# Patient Record
Sex: Female | Born: 1960 | Race: White | Hispanic: No | Marital: Married | Smoking: Never smoker
Health system: Southern US, Community
[De-identification: ages and names within clinical notes are randomized; demographics above are authoritative.]

## PROBLEM LIST (undated history)

## (undated) DIAGNOSIS — F419 Anxiety disorder, unspecified: Secondary | ICD-10-CM

## (undated) DIAGNOSIS — F329 Major depressive disorder, single episode, unspecified: Secondary | ICD-10-CM

## (undated) DIAGNOSIS — F32A Depression, unspecified: Secondary | ICD-10-CM

## (undated) DIAGNOSIS — N3010 Interstitial cystitis (chronic) without hematuria: Secondary | ICD-10-CM

## (undated) DIAGNOSIS — E119 Type 2 diabetes mellitus without complications: Secondary | ICD-10-CM

## (undated) DIAGNOSIS — R0981 Nasal congestion: Secondary | ICD-10-CM

## (undated) DIAGNOSIS — K5900 Constipation, unspecified: Secondary | ICD-10-CM

## (undated) DIAGNOSIS — G4730 Sleep apnea, unspecified: Secondary | ICD-10-CM

## (undated) DIAGNOSIS — K589 Irritable bowel syndrome without diarrhea: Secondary | ICD-10-CM

## (undated) HISTORY — DX: Sleep apnea, unspecified: G47.30

## (undated) HISTORY — DX: Anxiety disorder, unspecified: F41.9

## (undated) HISTORY — PX: DILATION AND CURETTAGE OF UTERUS: SHX78

## (undated) HISTORY — PX: SHOULDER ARTHROSCOPY W/ ROTATOR CUFF REPAIR: SHX2400

## (undated) HISTORY — DX: Type 2 diabetes mellitus without complications: E11.9

## (undated) HISTORY — DX: Constipation, unspecified: K59.00

## (undated) HISTORY — PX: MOHS SURGERY: SUR867

---
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
## 2006-06-15 ENCOUNTER — Ambulatory Visit: Payer: Self-pay | Admitting: Obstetrics and Gynecology

## 2006-08-18 ENCOUNTER — Other Ambulatory Visit: Payer: Self-pay

## 2006-08-23 ENCOUNTER — Ambulatory Visit: Payer: Self-pay | Admitting: Otolaryngology

## 2007-08-10 ENCOUNTER — Ambulatory Visit: Payer: Self-pay | Admitting: Obstetrics and Gynecology

## 2008-03-28 ENCOUNTER — Ambulatory Visit: Payer: Self-pay

## 2008-04-02 ENCOUNTER — Ambulatory Visit: Payer: Self-pay

## 2008-08-29 ENCOUNTER — Ambulatory Visit: Payer: Self-pay | Admitting: Obstetrics and Gynecology

## 2008-09-04 ENCOUNTER — Ambulatory Visit: Payer: Self-pay | Admitting: Obstetrics and Gynecology

## 2008-10-08 ENCOUNTER — Ambulatory Visit: Payer: Self-pay | Admitting: Gastroenterology

## 2008-10-08 HISTORY — PX: COLONOSCOPY: SHX174

## 2008-10-10 ENCOUNTER — Ambulatory Visit: Payer: Self-pay | Admitting: Family Medicine

## 2008-10-10 ENCOUNTER — Ambulatory Visit: Payer: Self-pay | Admitting: General Surgery

## 2008-10-18 ENCOUNTER — Ambulatory Visit: Payer: Self-pay | Admitting: General Surgery

## 2008-12-06 HISTORY — PX: OTHER SURGICAL HISTORY: SHX169

## 2009-07-10 ENCOUNTER — Ambulatory Visit: Payer: Self-pay | Admitting: Otolaryngology

## 2009-12-15 ENCOUNTER — Ambulatory Visit: Payer: Self-pay | Admitting: Family Medicine

## 2009-12-23 ENCOUNTER — Ambulatory Visit: Payer: Self-pay | Admitting: Family Medicine

## 2010-01-19 ENCOUNTER — Ambulatory Visit: Payer: Self-pay | Admitting: Family Medicine

## 2011-01-06 LAB — HM COLONOSCOPY

## 2011-03-02 ENCOUNTER — Ambulatory Visit: Payer: Self-pay | Admitting: Internal Medicine

## 2011-12-08 ENCOUNTER — Ambulatory Visit: Payer: Self-pay | Admitting: Unknown Physician Specialty

## 2011-12-20 ENCOUNTER — Ambulatory Visit: Payer: Self-pay | Admitting: Otolaryngology

## 2011-12-20 LAB — CBC WITH DIFFERENTIAL/PLATELET
Basophil #: 0.1 10*3/uL (ref 0.0–0.1)
Basophil %: 0.9 %
Eosinophil #: 0.1 10*3/uL (ref 0.0–0.7)
Eosinophil %: 1.1 %
HGB: 14.1 g/dL (ref 12.0–16.0)
Lymphocyte #: 1.9 10*3/uL (ref 1.0–3.6)
Lymphocyte %: 21 %
MCHC: 33.3 g/dL (ref 32.0–36.0)
Neutrophil %: 64.7 %
Platelet: 248 10*3/uL (ref 150–440)
RBC: 4.63 10*6/uL (ref 3.80–5.20)
RDW: 12.5 % (ref 11.5–14.5)

## 2012-05-18 ENCOUNTER — Ambulatory Visit: Payer: Self-pay | Admitting: Internal Medicine

## 2012-10-04 DIAGNOSIS — G4730 Sleep apnea, unspecified: Secondary | ICD-10-CM | POA: Insufficient documentation

## 2013-05-06 LAB — HM PAP SMEAR: HM Pap smear: NEGATIVE

## 2013-06-22 ENCOUNTER — Ambulatory Visit: Payer: Self-pay | Admitting: Internal Medicine

## 2014-04-18 ENCOUNTER — Ambulatory Visit: Payer: Self-pay

## 2014-04-18 LAB — URINALYSIS, COMPLETE
BILIRUBIN, UR: NEGATIVE
Blood: NEGATIVE
GLUCOSE, UR: NEGATIVE mg/dL (ref 0–75)
Ketone: NEGATIVE
LEUKOCYTE ESTERASE: NEGATIVE
NITRITE: NEGATIVE
PH: 7.5 (ref 4.5–8.0)
Protein: NEGATIVE
RBC,UR: NONE SEEN /HPF (ref 0–5)
Specific Gravity: 1.005 (ref 1.003–1.030)
Squamous Epithelial: NONE SEEN

## 2014-06-18 LAB — LIPID PANEL
CHOLESTEROL: 266 mg/dL — AB (ref 0–200)
HDL: 60 mg/dL (ref 35–70)
LDL Cholesterol: 174 mg/dL
Triglycerides: 158 mg/dL (ref 40–160)

## 2014-06-18 LAB — BASIC METABOLIC PANEL
BUN: 18 mg/dL (ref 4–21)
Creatinine: 0.7 mg/dL (ref <=1.1)

## 2014-06-18 LAB — TSH: TSH: 3.04 u[IU]/mL (ref <=5.90)

## 2014-07-09 ENCOUNTER — Ambulatory Visit: Payer: Self-pay | Admitting: Internal Medicine

## 2014-07-09 LAB — HM MAMMOGRAPHY: HM Mammogram: NORMAL

## 2015-05-14 ENCOUNTER — Ambulatory Visit
Admission: EM | Admit: 2015-05-14 | Discharge: 2015-05-14 | Disposition: A | Discharge status: Home / Self Care | Payer: BC Managed Care – PPO | Attending: Internal Medicine | Admitting: Internal Medicine

## 2015-05-14 DIAGNOSIS — N390 Urinary tract infection, site not specified: Secondary | ICD-10-CM | POA: Diagnosis present

## 2015-05-14 HISTORY — DX: Nasal congestion: R09.81

## 2015-05-14 LAB — URINALYSIS COMPLETE WITH MICROSCOPIC (ARMC ONLY)
Bilirubin Urine: NEGATIVE
Glucose, UA: NEGATIVE mg/dL
Ketones, ur: NEGATIVE mg/dL
Nitrite: NEGATIVE
Specific Gravity, Urine: 1.02 (ref 1.005–1.030)
pH: 6 (ref 5.0–8.0)

## 2015-05-14 MED ORDER — PHENAZOPYRIDINE HCL 200 MG PO TABS: 200.0000 mg | ORAL_TABLET | Freq: Three times a day (TID) | ORAL | Status: DC

## 2015-05-14 MED ORDER — CIPROFLOXACIN HCL 500 MG PO TABS: 500.0000 mg | ORAL_TABLET | Freq: Two times a day (BID) | ORAL | Status: DC

## 2015-05-14 NOTE — ED Notes (Signed)
Pt c/o painful urination with odor for the past 3 days with hx of UTI

## 2015-05-14 NOTE — Discharge Instructions (Signed)

## 2015-05-14 NOTE — ED Provider Notes (Signed)
CSN: [id number]     Arrival date & time 05/14/15  0708 History   None    Chief Complaint  Patient presents with  . 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  Would like pyridium today.  Patient is a 54 y.o. female presenting with urinary tract infection. The history is provided by the patient.  Urinary Tract Infection Pain quality:  Burning Pain severity:  No pain Onset quality:  Sudden Duration:  3 days Timing:  Intermittent Progression:  Unchanged Chronicity:  Recurrent Recent urinary tract infections: no   Relieved by:  Nothing Worsened by:  Nothing tried Ineffective treatments:  Cranberry juice Urinary symptoms: foul-smelling urine and frequent urination   Urinary symptoms: no discolored urine, no hematuria, no hesitancy and no bladder incontinence   Associated symptoms: no abdominal pain, no fever, no flank pain, no genital lesions, no nausea, no vaginal discharge and no vomiting   Risk factors: no hx of pyelonephritis, no hx of urolithiasis, no kidney transplant, not pregnant, no recurrent urinary tract infections, no renal cysts, no renal disease, not sexually active, not single kidney, no sexually transmitted infections and no urinary catheter     Past Medical History  Diagnosis Date  . Sinus congestion    Past Surgical History  Procedure Laterality Date  . Dilation and curettage of uterus      x2   History reviewed. No pertinent family history. History  Substance Use Topics  . Smoking status: Never Smoker   . Smokeless tobacco: Never Used  . Alcohol Use: Yes   OB History    Gravida Para Term Preterm AB TAB SAB Ectopic Multiple Living   4         2     Review of Systems   Constitutional: Negative for fever, chills, diaphoresis, activity change, appetite change and fatigue.  HENT: Negative for facial swelling, mouth sores and rhinorrhea.   Eyes: Negative for pain, discharge, redness and itching.  Respiratory: Negative for cough, shortness of breath and wheezing.   Cardiovascular: Negative for chest pain, palpitations and leg swelling.  Gastrointestinal: Negative for nausea, vomiting, abdominal pain, diarrhea, constipation, blood in stool and abdominal distention.  Endocrine: Negative for cold intolerance and heat intolerance.  Genitourinary: Positive for dysuria, urgency and frequency. Negative for hematuria, flank pain, decreased urine volume, vaginal discharge, difficulty urinating and genital sores.  Musculoskeletal: Negative for myalgias, back pain, joint swelling, arthralgias, gait problem, neck pain and neck stiffness.  Skin: Negative for color change, pallor, rash and wound.  Allergic/Immunologic: Positive for environmental allergies. Negative for food allergies.  Neurological: Negative for dizziness, tremors, syncope, facial asymmetry, weakness, light-headedness and headaches.  Hematological: Negative for adenopathy. Does not bruise/bleed easily.  Psychiatric/Behavioral: Negative for behavioral problems, confusion, sleep disturbance and agitation.    Allergies  Review of patient's allergies indicates no known allergies.  Home Medications   Prior to Admission medications   Medication Sig Start Date End Date Taking? Authorizing Provider  FLUoxetine (PROZAC) 40 MG capsule Take 40 mg by mouth daily.   Yes Historical Provider, MD  guaiFENesin (MUCINEX) 600 MG 12 hr tablet Take by mouth 2 (two) times daily as needed.   Yes Historical Provider, MD  Multiple Vitamins-Minerals (MULTIVITAMIN WITH MINERALS) tablet Take 1 tablet by mouth daily.  Yes Historical Provider, MD  ciprofloxacin (CIPRO) 500 MG tablet Take 1 tablet (500 mg total) by mouth every 12  (twelve) hours. 05/14/15   Olen Cordial, NP  phenazopyridine (PYRIDIUM) 200 MG tablet Take 1 tablet (200 mg total) by mouth 3 (three) times daily. 05/14/15   Aura Fey Betancourt, NP   BP 132/80 mmHg  Pulse 72  Temp(Src) 97.8 F (36.6 C) (Oral)  Resp 16  Ht 5\' 5"  (1.651 m)  Wt 158 lb (71.668 kg)  BMI 26.29 kg/m2  SpO2 99% Physical Exam  Constitutional: She is oriented to person, place, and time. Vital signs are normal. She appears well-developed and well-nourished. No distress.  HENT:  Head: Normocephalic and atraumatic.  Right Ear: External ear normal.  Left Ear: External ear normal.  Nose: Nose normal.  Mouth/Throat: Oropharynx is clear and moist. No oropharyngeal exudate.  Eyes: Conjunctivae, EOM and lids are normal. Pupils are equal, round, and reactive to light. Right eye exhibits no discharge. Left eye exhibits no discharge. No scleral icterus.  Neck: Trachea normal and normal range of motion. Neck supple. No tracheal deviation present.  Cardiovascular: Normal rate, regular rhythm, normal heart sounds and intact distal pulses.  Exam reveals no gallop and no friction rub.   No murmur heard. Pulmonary/Chest: Effort normal and breath sounds normal. No respiratory distress. She has no wheezes. She has no rales.  Abdominal: Soft. Bowel sounds are normal. She exhibits no distension, no fluid wave, no abdominal bruit, no ascites, no pulsatile midline mass and no mass. There is no tenderness. There is no rigidity, no rebound, no guarding, no CVA tenderness, no tenderness at McBurney's point and negative Murphy's sign.  Dull to percussion x 4 quads  Musculoskeletal: Normal range of motion. She exhibits no edema or tenderness.  Lymphadenopathy:    She has no cervical adenopathy.  Neurological: She is alert and oriented to person, place, and time. Coordination normal.  Skin: Skin is warm, dry and intact. No rash noted. She is not diaphoretic. No erythema. No pallor.  Psychiatric: She has a  normal mood and affect. Her speech is normal and behavior is normal. Judgment and thought content normal. Cognition and memory are normal.  Nursing note and vitals reviewed.   ED Course  Procedures (including critical care time) Labs Review Labs Reviewed  URINE CULTURE  URINALYSIS COMPLETEWITH MICROSCOPIC ([place])    Imaging Review No results found.   MDM   1. UTI (lower urinary tract infection)    Medications as directed.  Patient is also to push fluids and may use Pyridium po as needed. Call or return to clinic as needed if these symptoms worsen or fail to improve as anticipated.  Exitcare handout on cystitis given to patient Patient verbalized agreement and understanding of treatment plan and had no further questions at this time. P2:  Hydrate and cranberry juice    Olen Cordial, NP 05/14/15 [phone]

## 2015-05-15 DIAGNOSIS — F988 Other specified behavioral and emotional disorders with onset usually occurring in childhood and adolescence: Secondary | ICD-10-CM | POA: Insufficient documentation

## 2015-05-15 DIAGNOSIS — D509 Iron deficiency anemia, unspecified: Secondary | ICD-10-CM | POA: Insufficient documentation

## 2015-05-15 DIAGNOSIS — T7840XA Allergy, unspecified, initial encounter: Secondary | ICD-10-CM | POA: Insufficient documentation

## 2015-05-15 DIAGNOSIS — G4733 Obstructive sleep apnea (adult) (pediatric): Secondary | ICD-10-CM | POA: Insufficient documentation

## 2015-05-15 DIAGNOSIS — N3010 Interstitial cystitis (chronic) without hematuria: Secondary | ICD-10-CM | POA: Insufficient documentation

## 2015-05-15 DIAGNOSIS — F329 Major depressive disorder, single episode, unspecified: Secondary | ICD-10-CM | POA: Insufficient documentation

## 2015-05-15 DIAGNOSIS — Z8489 Family history of other specified conditions: Secondary | ICD-10-CM | POA: Insufficient documentation

## 2015-05-15 DIAGNOSIS — N6019 Diffuse cystic mastopathy of unspecified breast: Secondary | ICD-10-CM | POA: Insufficient documentation

## 2015-05-15 DIAGNOSIS — F419 Anxiety disorder, unspecified: Secondary | ICD-10-CM

## 2015-05-17 LAB — URINE CULTURE: Special Requests: NORMAL

## 2015-06-23 ENCOUNTER — Ambulatory Visit (INDEPENDENT_AMBULATORY_CARE_PROVIDER_SITE_OTHER): Payer: BC Managed Care – PPO | Admitting: Internal Medicine

## 2015-06-23 ENCOUNTER — Encounter: Payer: Self-pay | Admitting: Internal Medicine

## 2015-06-23 VITALS — BP 112/78 | HR 68 | Ht 65.0 in | Wt 164.4 lb

## 2015-06-23 DIAGNOSIS — G4730 Sleep apnea, unspecified: Secondary | ICD-10-CM

## 2015-06-23 DIAGNOSIS — Z1239 Encounter for other screening for malignant neoplasm of breast: Secondary | ICD-10-CM

## 2015-06-23 DIAGNOSIS — F419 Anxiety disorder, unspecified: Secondary | ICD-10-CM

## 2015-06-23 DIAGNOSIS — E785 Hyperlipidemia, unspecified: Secondary | ICD-10-CM

## 2015-06-23 DIAGNOSIS — Z0000 Encounter for general adult medical examination without abnormal findings: Secondary | ICD-10-CM | POA: Diagnosis not present

## 2015-06-23 DIAGNOSIS — F418 Other specified anxiety disorders: Secondary | ICD-10-CM | POA: Diagnosis not present

## 2015-06-23 DIAGNOSIS — F329 Major depressive disorder, single episode, unspecified: Secondary | ICD-10-CM

## 2015-06-23 DIAGNOSIS — D490 Neoplasm of unspecified behavior of digestive system: Secondary | ICD-10-CM

## 2015-06-23 DIAGNOSIS — F32A Depression, unspecified: Secondary | ICD-10-CM

## 2015-06-23 NOTE — Patient Instructions (Signed)

## 2015-06-23 NOTE — Progress Notes (Signed)
Date:  06/23/2015   Name:  Lauren Barrera   DOB:  August 03, 1961   MRN:  [id number]   Chief Complaint: Annual Exam Lauren Barrera is a 54 y.o. female who presents today for her Complete Annual Exam. She feels well. She reports exercising doing yoga 2-3/wk. She reports she is sleeping well. No breast complaints - Mammogram is due soon. Depression Well controlled symptoms on Prozac.  She is over the recent stress of her daughter's wedding.  She was unable to take Wellbutrin - constipation.  She is now sleeping well and does not need medication. Sleep Apnea Doing well with CPAP therapy.  She is very compliant and is followed by sleep specialist.  Review of Systems:  Review of Systems  Constitutional: Negative for fever, chills and unexpected weight change.  HENT: Positive for congestion. Negative for hearing loss, sore throat, trouble swallowing and voice change.   Eyes: Negative for visual disturbance.  Respiratory: Negative for cough, chest tightness, shortness of breath and wheezing.   Cardiovascular: Negative for chest pain, palpitations and leg swelling.  Gastrointestinal: Negative for abdominal pain, constipation and anal bleeding.  Genitourinary: Negative for dysuria, urgency, hematuria, vaginal bleeding, vaginal discharge and pelvic pain.  Musculoskeletal: Negative for back pain, joint swelling and gait problem.  Skin: Negative for color change and rash.  Neurological: Negative for headaches.  Hematological: Negative for adenopathy.  Psychiatric/Behavioral: Negative for sleep disturbance, dysphoric mood and decreased concentration.    Patient Active Problem List   Diagnosis Date Noted  . Hyperlipidemia 06/23/2015  . 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 Anxiety and depression 2015-05-22  . ADD (attention deficit disorder) 05/22/15  . Allergic state 05/22/2015  . Family history of sudden death 05-22-15  . Bloodgood disease 05-22-2015  . Chronic interstitial cystitis 22-May-2015  .  Hypochromic microcytic anemia May 22, 2015  . Obstructive sleep apnea of adult May 22, 2015  . Apnea, sleep 10/04/2012    Prior to Admission medications   Medication Sig Start Date End Date Taking? Authorizing Provider  FLUoxetine (PROZAC) 40 MG capsule Take 40 mg by mouth daily.   Yes Historical Provider, MD  guaiFENesin (MUCINEX) 600 MG 12 hr tablet Take by mouth 2 (two) times daily as needed.   Yes Historical Provider, MD  mometasone (NASONEX) 50 MCG/ACT nasal spray Place 2 sprays into the nose.   Yes Historical Provider, MD  Multiple Vitamins-Minerals (MULTIVITAMIN WITH MINERALS) tablet Take 1 tablet by mouth daily.   Yes Historical Provider, MD  buPROPion (WELLBUTRIN XL) 150 MG 24 hr tablet Take 1 tablet by mouth daily. 04/14/15   Historical Provider, MD  temazepam (RESTORIL) 15 MG capsule Take 1 capsule by mouth at bedtime. 04/14/15   Historical Provider, MD    Allergies  Allergen Reactions  . Codeine Other (See Comments)    Other Reaction: causes prickly sensation    Past Surgical History  Procedure Laterality Date  . Dilation and curettage of uterus      x2  . Appendectomy  2010  . Resection of endocrine tumor  2010    benign pancreatic lesion    History  Substance Use Topics  . Smoking status: Never Smoker   . Smokeless tobacco: Never Used  . Alcohol Use: Yes     Medication list has been reviewed and updated.  Physical Examination:  Physical Exam  Constitutional: She is oriented to person, place, and time. She appears well-developed and well-nourished. No distress.  HENT:  Head: Normocephalic and atraumatic.  Right Ear: Tympanic membrane and  ear canal normal.  Left Ear: Tympanic membrane and ear canal normal.  Nose: Nose normal. Right sinus exhibits no maxillary sinus tenderness. Left sinus exhibits no maxillary sinus tenderness.  Eyes: Conjunctivae are normal. Right eye exhibits no discharge. Left eye exhibits no discharge. No scleral icterus.  Neck: Normal range  of motion. Neck supple. No tracheal tenderness present. Carotid bruit is not present. No thyromegaly present.  Cardiovascular: Normal rate, regular rhythm, S1 normal and normal pulses.   Pulmonary/Chest: Effort normal and breath sounds normal. No respiratory distress. Right breast exhibits no mass, no nipple discharge, no skin change and no tenderness. Left breast exhibits no mass, no nipple discharge, no skin change and no tenderness.  Abdominal: Soft. Normal appearance and bowel sounds are normal. There is no hepatosplenomegaly. There is no tenderness. There is no CVA tenderness.  Musculoskeletal: Normal range of motion.       Right knee: Normal.       Left knee: Normal.  Lymphadenopathy:    She has no cervical adenopathy.    She has no axillary adenopathy.  Neurological: She is alert and oriented to person, place, and time. She has normal strength and normal reflexes.  Skin: Skin is warm, dry and intact. No rash noted.  Psychiatric: She has a normal mood and affect. Her speech is normal and behavior is normal. Thought content normal. Cognition and memory are normal.    BP 112/78 mmHg  Pulse 68  Ht 5\' 5"  (1.651 m)  Wt 164 lb 6.4 oz (74.571 kg)  BMI 27.36 kg/m2  Assessment and Plan: 1. Annual physical exam - POCT urinalysis dipstick - CBC with Differential/Platelet - Comprehensive metabolic panel  2. Anxiety and depression The current medical regimen is effective;  continue present plan and medications. - TSH  3. Apnea, sleep Stable on CPAP therapy  4. Breast cancer screening - MM DIGITAL SCREENING BILATERAL; Future  5. Hyperlipidemia - Lipid panel  6. Pancreatic endocrine tumor Will refer to [facility] next year for colonoscopy and discussion of follow up.   Halina Maidens, MD [facility]  06/23/2015

## 2015-06-24 LAB — COMPREHENSIVE METABOLIC PANEL
ALBUMIN: 4.2 g/dL (ref 3.5–5.5)
ALT: 20 IU/L (ref 0–32)
AST: 17 IU/L (ref 0–40)
Albumin/Globulin Ratio: 1.7 (ref 1.1–2.5)
Alkaline Phosphatase: 108 IU/L (ref 39–117)
BUN/Creatinine Ratio: 27 — ABNORMAL HIGH (ref 9–23)
BUN: 16 mg/dL (ref 6–24)
Bilirubin Total: 0.5 mg/dL (ref 0.0–1.2)
CO2: 26 mmol/L (ref 18–29)
Calcium: 9.6 mg/dL (ref 8.7–10.2)
Chloride: 99 mmol/L (ref 97–108)
Creatinine, Ser: 0.6 mg/dL (ref 0.57–1.00)
GFR calc non Af Amer: 103 mL/min/{1.73_m2} (ref >59)
GFR, EST AFRICAN AMERICAN: 119 mL/min/{1.73_m2} (ref >59)
GLOBULIN, TOTAL: 2.5 g/dL (ref 1.5–4.5)
Glucose: 103 mg/dL — ABNORMAL HIGH (ref 65–99)
POTASSIUM: 5.4 mmol/L — AB (ref 3.5–5.2)
SODIUM: 141 mmol/L (ref 134–144)
TOTAL PROTEIN: 6.7 g/dL (ref 6.0–8.5)

## 2015-06-24 LAB — CBC WITH DIFFERENTIAL/PLATELET
Basophils Absolute: 0 10*3/uL (ref 0.0–0.2)
Basos: 1 %
EOS (ABSOLUTE): 0.1 10*3/uL (ref 0.0–0.4)
Eos: 2 %
HEMATOCRIT: 42.1 % (ref 34.0–46.6)
Hemoglobin: 13.8 g/dL (ref 11.1–15.9)
Immature Grans (Abs): 0 10*3/uL (ref 0.0–0.1)
Immature Granulocytes: 0 %
Lymphocytes Absolute: 1.4 10*3/uL (ref 0.7–3.1)
Lymphs: 22 %
MCH: 30.2 pg (ref 26.6–33.0)
MCHC: 32.8 g/dL (ref 31.5–35.7)
MCV: 92 fL (ref 79–97)
Monocytes Absolute: 0.7 10*3/uL (ref 0.1–0.9)
Monocytes: 11 %
Neutrophils Absolute: 4.3 10*3/uL (ref 1.4–7.0)
Neutrophils: 64 %
PLATELETS: 256 10*3/uL (ref 150–379)
RBC: 4.57 x10E6/uL (ref 3.77–5.28)
RDW: 13.9 % (ref 12.3–15.4)
WBC: 6.6 10*3/uL (ref 3.4–10.8)

## 2015-06-24 LAB — LIPID PANEL
CHOL/HDL RATIO: 4.8 ratio — AB (ref 0.0–4.4)
Cholesterol, Total: 247 mg/dL — ABNORMAL HIGH (ref 100–199)
HDL: 52 mg/dL (ref >39)
LDL Calculated: 136 mg/dL — ABNORMAL HIGH (ref 0–99)
Triglycerides: 295 mg/dL — ABNORMAL HIGH (ref 0–149)
VLDL Cholesterol Cal: 59 mg/dL — ABNORMAL HIGH (ref 5–40)

## 2015-06-24 LAB — TSH: TSH: 3.39 u[IU]/mL (ref 0.450–4.500)

## 2015-07-14 ENCOUNTER — Ambulatory Visit: Payer: BC Managed Care – PPO

## 2015-07-15 ENCOUNTER — Ambulatory Visit
Admission: RE | Admit: 2015-07-15 | Discharge: 2015-07-15 | Disposition: A | Discharge status: Home / Self Care | Payer: BC Managed Care – PPO | Source: Ambulatory Visit | Attending: Internal Medicine | Admitting: Internal Medicine

## 2015-07-15 DIAGNOSIS — Z1231 Encounter for screening mammogram for malignant neoplasm of breast: Secondary | ICD-10-CM | POA: Diagnosis not present

## 2015-07-15 DIAGNOSIS — Z1239 Encounter for other screening for malignant neoplasm of breast: Secondary | ICD-10-CM

## 2015-09-23 ENCOUNTER — Encounter: Payer: Self-pay | Admitting: Internal Medicine

## 2015-09-23 ENCOUNTER — Ambulatory Visit (INDEPENDENT_AMBULATORY_CARE_PROVIDER_SITE_OTHER): Payer: BC Managed Care – PPO | Admitting: Internal Medicine

## 2015-09-23 VITALS — BP 98/64 | HR 68 | Ht 65.0 in | Wt 170.0 lb

## 2015-09-23 DIAGNOSIS — B351 Tinea unguium: Secondary | ICD-10-CM | POA: Diagnosis not present

## 2015-09-23 DIAGNOSIS — S63619A Unspecified sprain of unspecified finger, initial encounter: Secondary | ICD-10-CM | POA: Diagnosis not present

## 2015-09-23 NOTE — Progress Notes (Signed)
Date:  09/23/2015   Name:  Lauren Barrera   DOB:  Jan 29, 1961   MRN:  [id number]   Chief Complaint: Hand Pain [place] She hyperextended the middle finger on her left hand at the PIP joint. She went to the emergency room where x-rays were negative. I did a nerve block and replaced the joint and splinted it. She's taken Tylenol for pain. Joint is still swollen and tender but she can flex it about 30%. She does have some tingling along the medial aspect.    Review of Systems  Constitutional: Negative for fever.  Musculoskeletal: Positive for joint swelling and arthralgias.  Skin: Negative for color change and rash.       Fungal involvement of the great toenail on the left foot    Patient Active Problem List   Diagnosis Date Noted  . Hyperlipidemia 06/23/2015  . 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 Anxiety and depression 06-10-15  . ADD (attention deficit disorder) 2015-06-10  . Allergic state 10-Jun-2015  . Family history of sudden death 2015-06-10  . Bloodgood disease 06-10-15  . Chronic interstitial cystitis 06-10-2015  . Hypochromic microcytic anemia 06-10-2015  . Obstructive sleep apnea of adult 06-10-2015  . Apnea, sleep 10/04/2012    Prior to Admission medications   Medication Sig Start Date End Date Taking? Authorizing Provider  FLUoxetine (PROZAC) 40 MG capsule Take 40 mg by mouth daily.   Yes Historical Provider, MD  guaiFENesin (MUCINEX) 600 MG 12 hr tablet Take by mouth 2 (two) times daily as needed.   Yes Historical Provider, MD  mometasone (NASONEX) 50 MCG/ACT nasal spray Place 2 sprays into the nose.   Yes Historical Provider, MD  Multiple Vitamins-Minerals (MULTIVITAMIN WITH MINERALS) tablet Take 1 tablet by mouth daily.   Yes Historical Provider, MD    Allergies  Allergen Reactions  . Codeine Other (See Comments)    Other Reaction: causes prickly sensation    Past Surgical History  Procedure Laterality Date  .  Dilation and curettage of uterus      x2  . Appendectomy  2010  . Resection of endocrine tumor  2010    benign pancreatic lesion  . Breast excisional biopsy Bilateral 1976    Social History  Substance Use Topics  . Smoking status: Never Smoker   . Smokeless tobacco: Never Used  . Alcohol Use: 0.0 oz/week    0 Standard drinks or equivalent per week    Medication list has been reviewed and updated.   Physical Exam  Constitutional: She is oriented to person, place, and time. She appears well-developed and well-nourished.  Cardiovascular: Normal rate, regular rhythm and normal heart sounds.   Pulmonary/Chest: Effort normal and breath sounds normal.  Musculoskeletal:  Left middle finger - PIP joint discolored and swollen.  Sensation normal but tender to touch  Neurological: She is alert and oriented to person, place, and time.  Skin:  Mild fungal involvement of medial side of distal left great toe nail  Psychiatric: She has a normal mood and affect.  Nursing note and vitals reviewed.   BP 98/64 mmHg  Pulse 68  Ht 5\' 5"  (1.651 m)  Wt 170 lb (77.111 kg)  BMI 28.29 kg/m2  Assessment and Plan: 1. Finger sprain, initial encounter Continue tylenol as needed - Ambulatory referral to Orthopedic Surgery  2. Onychomycosis Mild disease - try Vicks Vapor rub nightly  Halina Maidens, MD [facility]  09/23/2015

## 2015-10-13 ENCOUNTER — Encounter: Payer: Self-pay | Admitting: Internal Medicine

## 2015-10-13 ENCOUNTER — Ambulatory Visit (INDEPENDENT_AMBULATORY_CARE_PROVIDER_SITE_OTHER): Payer: BC Managed Care – PPO | Admitting: Internal Medicine

## 2015-10-13 VITALS — BP 140/90 | HR 72 | Ht 65.0 in | Wt 169.0 lb

## 2015-10-13 DIAGNOSIS — S40011A Contusion of right shoulder, initial encounter: Secondary | ICD-10-CM | POA: Diagnosis not present

## 2015-10-13 NOTE — Patient Instructions (Signed)
Ibuprofen 800 mg three times a day as needed with food  Ice or heat three times a day for 20 minutes

## 2015-10-13 NOTE — Progress Notes (Signed)
Date:  10/13/2015   Name:  KHRISTA BRAUN   DOB:  23-Feb-1961   MRN:  [id number]   Chief Complaint: Shoulder Pain Shoulder Pain  This is a new problem. The current episode started in the past 7 days. The problem occurs constantly. The problem has been unchanged. Pertinent negatives include no numbness.   She fell yesterday at home in the garage. She landed on her shoulder with her arm under her rather than outstretched. She was able to go ahead and get up and do her usual activities. Last night it started to hurt so she began taking ibuprofen. She is still uncomfortable once the ibuprofen wears off. She can move it in most directions but internal rotation is most uncomfortable. She denies any numbness or tingling in the hand. She did not hit her head or lose consciousness when she fell.  Review of Systems  Eyes: Negative for visual disturbance.  Cardiovascular: Positive for chest pain.  Musculoskeletal: Positive for arthralgias. Negative for joint swelling and neck pain.  Neurological: Negative for weakness, light-headedness, numbness and headaches.    Patient Active Problem List   Diagnosis Date Noted  . Hyperlipidemia 06/23/2015  . 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 Anxiety and depression 05-16-15  . ADD (attention deficit disorder) 05-16-15  . Allergic state May 16, 2015  . Family history of sudden death May 16, 2015  . Bloodgood disease May 16, 2015  . Chronic interstitial cystitis 05-16-2015  . Hypochromic microcytic anemia May 16, 2015  . Obstructive sleep apnea of adult May 16, 2015  . Apnea, sleep 10/04/2012    Prior to Admission medications   Medication Sig Start Date End Date Taking? Authorizing Provider  FLUoxetine (PROZAC) 40 MG capsule Take 40 mg by mouth daily.   Yes Historical Provider, MD  guaiFENesin (MUCINEX) 600 MG 12 hr tablet Take by mouth 2 (two) times daily as needed.   Yes Historical Provider, MD  mometasone (NASONEX) 50 MCG/ACT nasal spray Place 2 sprays into the  nose.   Yes Historical Provider, MD  Multiple Vitamins-Minerals (MULTIVITAMIN WITH MINERALS) tablet Take 1 tablet by mouth daily.   Yes Historical Provider, MD    Allergies  Allergen Reactions  . Codeine Other (See Comments)    Other Reaction: causes prickly sensation    Past Surgical History  Procedure Laterality Date  . Dilation and curettage of uterus      x2  . Appendectomy  2010  . Resection of endocrine tumor  2010    benign pancreatic lesion  . Breast excisional biopsy Bilateral 1976    Social History  Substance Use Topics  . Smoking status: Never Smoker   . Smokeless tobacco: Never Used  . Alcohol Use: 0.0 oz/week    0 Standard drinks or equivalent per week    Medication list has been reviewed and updated.   Physical Exam  Constitutional: She is oriented to person, place, and time. She appears well-developed and well-nourished.  Cardiovascular: Normal rate, regular rhythm and normal heart sounds.   Pulses:      Radial pulses are 2+ on the right side, and 2+ on the left side.  Pulmonary/Chest: Effort normal and breath sounds normal.  Musculoskeletal:       Arms: Neurological: She is alert and oriented to person, place, and time. She has normal strength.  Psychiatric: She has a normal mood and affect.  Nursing note and vitals reviewed.   BP 140/90 mmHg  Pulse 72  Ht 5\' 5"  (1.651 m)  Wt 169 lb (76.658 kg)  BMI 28.12  kg/m2  Assessment and Plan: 1. Shoulder contusion, right, initial encounter Continue ibuprofen 800 mg 3 times a day with food Ice or heat 3 times a day for 20 minutes Call for referral if not improving within 3 days  Halina Maidens, MD [facility]  10/13/2015

## 2015-11-06 ENCOUNTER — Other Ambulatory Visit: Payer: Self-pay | Admitting: Internal Medicine

## 2015-11-11 ENCOUNTER — Other Ambulatory Visit: Payer: Self-pay | Admitting: Internal Medicine

## 2015-12-25 ENCOUNTER — Ambulatory Visit (INDEPENDENT_AMBULATORY_CARE_PROVIDER_SITE_OTHER): Payer: BC Managed Care – PPO | Admitting: Internal Medicine

## 2015-12-25 ENCOUNTER — Encounter: Payer: Self-pay | Admitting: Internal Medicine

## 2015-12-25 VITALS — BP 134/78 | HR 72 | Temp 97.5°F | Ht 65.0 in | Wt 162.0 lb

## 2015-12-25 DIAGNOSIS — J019 Acute sinusitis, unspecified: Secondary | ICD-10-CM | POA: Diagnosis not present

## 2015-12-25 MED ORDER — AMOXICILLIN-POT CLAVULANATE 875-125 MG PO TABS: 1.0000 | ORAL_TABLET | Freq: Two times a day (BID) | ORAL | Status: DC

## 2015-12-25 NOTE — Progress Notes (Signed)
Date:  12/25/2015   Name:  Lauren Barrera   DOB:  [birth year]   MRN:  [id number]   Chief Complaint: Sinusitis Sinusitis This is a new problem. The current episode started yesterday. The problem has been gradually worsening since onset. There has been no fever. Associated symptoms include congestion, coughing, headaches, sinus pressure and a sore throat. Pertinent negatives include no diaphoresis, ear pain or shortness of breath. Past treatments include spray decongestants. The treatment provided mild relief.     Review of Systems  Constitutional: Negative for fever and diaphoresis.  HENT: Positive for congestion, sinus pressure and sore throat. Negative for ear pain.   Respiratory: Positive for cough. Negative for shortness of breath and wheezing.   Cardiovascular: Negative for chest pain.  Gastrointestinal: Negative for nausea and diarrhea.  Neurological: Positive for headaches. Negative for dizziness, syncope and numbness.    Patient Active Problem List   Diagnosis Date Noted  . Hyperlipidemia 06/23/2015  . 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 Anxiety and depression 2015/06/12  . ADD (attention deficit disorder) 2015-06-12  . Allergic state Jun 12, 2015  . Family history of sudden death 06/12/2015  . Bloodgood disease June 12, 2015  . Chronic interstitial cystitis 12-Jun-2015  . Hypochromic microcytic anemia 06-12-2015  . Obstructive sleep apnea of adult 06/12/2015  . Apnea, sleep 10/04/2012    Prior to Admission medications   Medication Sig Start Date End Date Taking? Authorizing Provider  FLUoxetine (PROZAC) 40 MG capsule TAKE 2 CAPSULE BY MOUTH EVERY DAY 11/11/15  Yes Glean Hess, MD  guaiFENesin (MUCINEX) 600 MG 12 hr tablet Take by mouth 2 (two) times daily as needed.   Yes Historical Provider, MD  Multiple Vitamins-Minerals (MULTIVITAMIN WITH MINERALS) tablet Take 1 tablet by mouth daily.   Yes Historical Provider, MD    Allergies  Allergen Reactions  . Codeine  Other (See Comments)    Other Reaction: causes prickly sensation    Past Surgical History  Procedure Laterality Date  . Dilation and curettage of uterus      x2  . Appendectomy  2010  . Resection of endocrine tumor  2010    benign pancreatic lesion  . Breast excisional biopsy Bilateral 1976    Social History  Substance Use Topics  . Smoking status: Never Smoker   . Smokeless tobacco: Never Used  . Alcohol Use: 0.0 oz/week    0 Standard drinks or equivalent per week     Comment: occasional     Medication list has been reviewed and updated.   Physical Exam  Constitutional: She is oriented to person, place, and time. She appears well-developed and well-nourished.  HENT:  Right Ear: External ear and ear canal normal. Tympanic membrane is not erythematous and not retracted.  Left Ear: External ear and ear canal normal. Tympanic membrane is not erythematous and not retracted.  Nose: Right sinus exhibits maxillary sinus tenderness and frontal sinus tenderness. Left sinus exhibits maxillary sinus tenderness and frontal sinus tenderness.  Mouth/Throat: Uvula is midline and mucous membranes are normal. No oral lesions. Posterior oropharyngeal erythema present. No oropharyngeal exudate.  Cardiovascular: Normal rate, regular rhythm and normal heart sounds.   Pulmonary/Chest: Breath sounds normal. She has no wheezes. She has no rales.  Lymphadenopathy:    She has no cervical adenopathy.  Neurological: She is alert and oriented to person, place, and time.    BP 134/78 mmHg  Pulse 72  Temp(Src) 97.5 F (36.4 C) (Oral)  Ht 5\' 5"  (1.651 m)  Wt 162 lb (73.483 kg)  BMI 26.96 kg/m2  SpO2 96%  Assessment and Plan: 1. Acute sinusitis, recurrence not specified, unspecified location Continue Nasacort NS Sudafed 30 mg bid - amoxicillin-clavulanate (AUGMENTIN) 875-125 MG tablet; Take 1 tablet by mouth 2 (two) times daily.  Dispense: 20 tablet; Refill: 0   Halina Maidens, MD [facility]  12/25/2015

## 2015-12-25 NOTE — Patient Instructions (Signed)
Sudafed 30 mg  - take one twice a day

## 2016-01-26 ENCOUNTER — Telehealth: Payer: Self-pay

## 2016-01-26 NOTE — Telephone Encounter (Signed)
I need more specific information in order to determine if she needs to see GI.  I recommend seeing me first since GI will want to see my notes before scheduling her appointment.

## 2016-01-26 NOTE — Telephone Encounter (Signed)
Patient called in today stating that she is having some GI problems and wanted to know if she should come in and be seen for this given her history. She wanted to know if she should come in here or see GI or if she needed a referral. Please advise.

## 2016-01-27 ENCOUNTER — Ambulatory Visit: Payer: BC Managed Care – PPO | Attending: Otolaryngology

## 2016-01-27 DIAGNOSIS — G4731 Primary central sleep apnea: Secondary | ICD-10-CM | POA: Insufficient documentation

## 2016-01-27 DIAGNOSIS — G4761 Periodic limb movement disorder: Secondary | ICD-10-CM | POA: Insufficient documentation

## 2016-01-27 NOTE — Telephone Encounter (Signed)
Spoke with pt. Pt scheduled appt for 02/03/2016.

## 2016-02-03 ENCOUNTER — Ambulatory Visit: Payer: Self-pay | Admitting: Internal Medicine

## 2016-02-04 ENCOUNTER — Ambulatory Visit (INDEPENDENT_AMBULATORY_CARE_PROVIDER_SITE_OTHER): Payer: BC Managed Care – PPO | Admitting: Internal Medicine

## 2016-02-04 ENCOUNTER — Encounter: Payer: Self-pay | Admitting: Internal Medicine

## 2016-02-04 VITALS — BP 120/80 | HR 64 | Ht 65.0 in | Wt 160.0 lb

## 2016-02-04 DIAGNOSIS — M25511 Pain in right shoulder: Secondary | ICD-10-CM

## 2016-02-04 DIAGNOSIS — K5900 Constipation, unspecified: Secondary | ICD-10-CM

## 2016-02-04 MED ORDER — LUBIPROSTONE 24 MCG PO CAPS: 24.0000 ug | ORAL_CAPSULE | Freq: Two times a day (BID) | ORAL | Status: DC

## 2016-02-04 NOTE — Progress Notes (Signed)
Date:  02/04/2016   Name:  Lauren Barrera   DOB:  1960-12-16   MRN:  [id number]   Chief Complaint: Abdominal Pain and Shoulder Pain Shoulder Pain  This is a chronic problem. The current episode started more than 1 month ago (had 2 falls last fall - last one in November). There has been a history of trauma (fell in november). The problem has been waxing and waning. The quality of the pain is described as aching. Pertinent negatives include no fever. She has tried NSAIDS and rest for the symptoms. The treatment provided mild (she changed her yoga routine but still has pain that is not improving) relief.  Constipation This is a recurrent problem. The current episode started more than 1 year ago. The problem has been waxing and waning since onset. The stool is described as scybalous. Associated symptoms include diarrhea. Pertinent negatives include no abdominal pain, difficulty urinating, fecal incontinence, fever, rectal pain or vomiting. Risk factors include change in medication usage/dosage. She has tried fiber for the symptoms. Her past medical history is significant for irritable bowel syndrome.  She has very small soft stools about every other day.  Feels bloated and uncomfortable.  No bleeding.  No vomiting.  She was previously taking fiber supplements but stopped those about 6 weeks ago.   Review of Systems  Constitutional: Negative for fever and unexpected weight change.  Respiratory: Negative for chest tightness and shortness of breath.   Cardiovascular: Negative for chest pain and leg swelling.  Gastrointestinal: Positive for diarrhea and constipation. Negative for vomiting, abdominal pain, blood in stool and rectal pain.  Genitourinary: Negative for difficulty urinating.  Musculoskeletal: Positive for arthralgias (right shoulder pain).    Patient Active Problem List   Diagnosis Date Noted  . Hyperlipidemia 06/23/2015  . 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 Anxiety and depression  May 23, 2015  . ADD (attention deficit disorder) 05/23/15  . Allergic state 05-23-15  . Family history of sudden death 23-May-2015  . Bloodgood disease 05-23-2015  . Chronic interstitial cystitis 05-23-15  . Hypochromic microcytic anemia 2015/05/23  . Obstructive sleep apnea of adult 23-May-2015  . Apnea, sleep 10/04/2012    Prior to Admission medications   Medication Sig Start Date End Date Taking? Authorizing Provider  FLUoxetine (PROZAC) 40 MG capsule TAKE 2 CAPSULE BY MOUTH EVERY DAY 11/11/15  Yes Glean Hess, MD  Multiple Vitamins-Minerals (MULTIVITAMIN WITH MINERALS) tablet Take 1 tablet by mouth daily.   Yes Historical Provider, MD  amoxicillin-clavulanate (AUGMENTIN) 875-125 MG tablet Take 1 tablet by mouth 2 (two) times daily. 12/25/15   Glean Hess, MD  guaiFENesin (MUCINEX) 600 MG 12 hr tablet Take by mouth 2 (two) times daily as needed.    Historical Provider, MD    Allergies  Allergen Reactions  . Codeine Other (See Comments)    Other Reaction: causes prickly sensation    Past Surgical History  Procedure Laterality Date  . Dilation and curettage of uterus      x2  . Appendectomy  2010  . Resection of endocrine tumor  2010    benign pancreatic lesion  . Breast excisional biopsy Bilateral 1976    Social History  Substance Use Topics  . Smoking status: Never Smoker   . Smokeless tobacco: Never Used  . Alcohol Use: 0.0 oz/week    0 Standard drinks or equivalent per week     Comment: occasional     Medication list has been reviewed and updated.  Physical Exam  Constitutional: She is oriented to person, place, and time. She appears well-developed. No distress.  HENT:  Head: Normocephalic and atraumatic.  Cardiovascular: Normal rate, regular rhythm and normal heart sounds.   Pulmonary/Chest: Effort normal and breath sounds normal. No respiratory distress.  Abdominal: Soft. Normal appearance. She exhibits no distension. Bowel sounds are decreased.  There is no hepatosplenomegaly. There is tenderness in the left lower quadrant. There is no rigidity, no rebound, no guarding and no CVA tenderness.  Musculoskeletal:       Right shoulder: She exhibits decreased range of motion (to rear ward extension) and tenderness. She exhibits no bony tenderness and no swelling.  Neurological: She is alert and oriented to person, place, and time.  Skin: Skin is warm and dry. No rash noted.  Psychiatric: She has a normal mood and affect. Her behavior is normal. Thought content normal.  Nursing note and vitals reviewed.   BP 120/80 mmHg  Pulse 64  Ht 5\' 5"  (1.651 m)  Wt 160 lb (72.576 kg)  BMI 26.63 kg/m2  Assessment and Plan: 1. Constipation, unspecified constipation type Begin Amitiza 24 mcg bid - 8 days of samples given  2. Right shoulder pain - Ambulatory referral to [facility]  02/04/2016

## 2016-02-11 ENCOUNTER — Other Ambulatory Visit: Payer: Self-pay

## 2016-02-11 DIAGNOSIS — K5900 Constipation, unspecified: Secondary | ICD-10-CM

## 2016-02-11 MED ORDER — LUBIPROSTONE 24 MCG PO CAPS: 24.0000 ug | ORAL_CAPSULE | Freq: Two times a day (BID) | ORAL | Status: DC

## 2016-02-11 NOTE — Telephone Encounter (Signed)
Patient called in today and asked if she could have a refill for Amitiza.

## 2016-02-13 ENCOUNTER — Other Ambulatory Visit: Payer: Self-pay

## 2016-02-13 DIAGNOSIS — K5900 Constipation, unspecified: Secondary | ICD-10-CM

## 2016-02-13 MED ORDER — LUBIPROSTONE 24 MCG PO CAPS: 24.0000 ug | ORAL_CAPSULE | Freq: Two times a day (BID) | ORAL | Status: DC

## 2016-02-13 NOTE — Telephone Encounter (Signed)
Patient called in and states that they have still not received the Rx.

## 2016-02-19 ENCOUNTER — Ambulatory Visit: Payer: BC Managed Care – PPO | Attending: Otolaryngology

## 2016-02-19 DIAGNOSIS — G4733 Obstructive sleep apnea (adult) (pediatric): Secondary | ICD-10-CM | POA: Insufficient documentation

## 2016-02-19 DIAGNOSIS — G4737 Central sleep apnea in conditions classified elsewhere: Secondary | ICD-10-CM | POA: Diagnosis not present

## 2016-02-19 DIAGNOSIS — G4710 Hypersomnia, unspecified: Secondary | ICD-10-CM | POA: Diagnosis present

## 2016-02-19 DIAGNOSIS — R0683 Snoring: Secondary | ICD-10-CM | POA: Diagnosis present

## 2016-02-23 ENCOUNTER — Ambulatory Visit (INDEPENDENT_AMBULATORY_CARE_PROVIDER_SITE_OTHER): Payer: BC Managed Care – PPO | Admitting: Internal Medicine

## 2016-02-23 ENCOUNTER — Encounter: Payer: Self-pay | Admitting: Internal Medicine

## 2016-02-23 VITALS — BP 132/82 | HR 84 | Ht 65.0 in | Wt 167.0 lb

## 2016-02-23 DIAGNOSIS — N950 Postmenopausal bleeding: Secondary | ICD-10-CM | POA: Diagnosis not present

## 2016-02-23 DIAGNOSIS — K5901 Slow transit constipation: Secondary | ICD-10-CM | POA: Insufficient documentation

## 2016-02-23 NOTE — Progress Notes (Signed)
Date:  02/23/2016   Name:  Lauren Barrera   DOB:  1960/12/30   MRN:  [id number]   Chief Complaint: Vaginal Bleeding Vaginal Bleeding The patient's primary symptoms include vaginal bleeding. The patient's pertinent negatives include no pelvic pain. This is a new problem. The current episode started in the past 7 days. The problem occurs constantly. The patient is experiencing no pain. Associated symptoms include constipation and flank pain. Pertinent negatives include no abdominal pain, chills, diarrhea, fever or vomiting.  Constipation This is a chronic problem. The problem has been gradually improving since onset. Pertinent negatives include no abdominal pain, diarrhea, fever, rectal pain or vomiting. She has tried laxatives (started [place] last visit - twice a day too much so decreased to once a day) for the symptoms.  Last menses was 10 years ago.      Review of Systems  Constitutional: Negative for fever, chills and fatigue.  Respiratory: Negative for chest tightness and shortness of breath.   Cardiovascular: Negative for chest pain.  Gastrointestinal: Positive for constipation. Negative for vomiting, abdominal pain, diarrhea and rectal pain.  Genitourinary: Positive for flank pain and vaginal bleeding. Negative for vaginal pain and pelvic pain.    Patient Active Problem List   Diagnosis Date Noted  . Hyperlipidemia 06/23/2015  . 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 Anxiety and depression June 14, 2015  . ADD (attention deficit disorder) 2015/06/14  . Allergic state 06-14-15  . Family history of sudden death 14-Jun-2015  . Bloodgood disease 06/14/15  . Chronic interstitial cystitis 2015-06-14  . Hypochromic microcytic anemia 06/14/15  . Obstructive sleep apnea of adult 06/14/2015  . Apnea, sleep 10/04/2012    Prior to Admission medications   Medication Sig Start Date End Date Taking? Authorizing Provider  FLUoxetine (PROZAC) 40 MG capsule TAKE 2 CAPSULE BY MOUTH EVERY  DAY 11/11/15  Yes Glean Hess, MD  guaiFENesin (MUCINEX) 600 MG 12 hr tablet Take 600 mg by mouth 2 (two) times daily.   Yes Historical Provider, MD  lubiprostone (AMITIZA) 24 MCG capsule Take 1 capsule (24 mcg total) by mouth 2 (two) times daily with a meal. 02/13/16  Yes Glean Hess, MD  Multiple Vitamins-Minerals (MULTIVITAMIN WITH MINERALS) tablet Take 1 tablet by mouth daily.   Yes Historical Provider, MD    Allergies  Allergen Reactions  . Codeine Other (See Comments)    Other Reaction: causes prickly sensation    Past Surgical History  Procedure Laterality Date  . Dilation and curettage of uterus      x2  . Appendectomy  2010  . Resection of endocrine tumor  2010    benign pancreatic lesion  . Breast excisional biopsy Bilateral 1976    Social History  Substance Use Topics  . Smoking status: Never Smoker   . Smokeless tobacco: Never Used  . Alcohol Use: 0.0 oz/week    0 Standard drinks or equivalent per week     Comment: occasional     Medication list has been reviewed and updated.   Physical Exam  Constitutional: She is oriented to person, place, and time. She appears well-developed. No distress.  HENT:  Head: Normocephalic and atraumatic.  Cardiovascular: Normal rate, regular rhythm and normal heart sounds.   Pulmonary/Chest: Effort normal and breath sounds normal. No respiratory distress.  Abdominal: Soft. Normal appearance and bowel sounds are normal. She exhibits no mass. There is no tenderness. There is no rebound and no guarding.  Genitourinary: Vagina normal and uterus normal. There  is no tenderness, lesion or injury on the right labia. There is no tenderness, lesion or injury on the left labia. Cervix exhibits discharge (scant bloody drainage). Cervix exhibits no motion tenderness and no friability. Right adnexum displays no mass, no tenderness and no fullness. Left adnexum displays no mass, no tenderness and no fullness. No tenderness in the vagina.    Musculoskeletal: Normal range of motion.  Neurological: She is alert and oriented to person, place, and time.  Skin: Skin is warm and dry. No rash noted.  Psychiatric: She has a normal mood and affect. Her behavior is normal. Thought content normal.  Nursing note and vitals reviewed.   BP 132/82 mmHg  Pulse 84  Ht 5\' 5"  (1.651 m)  Wt 167 lb (75.751 kg)  BMI 27.79 kg/m2  Assessment and Plan: 1. Post-menopausal bleeding Will likely need to refer to [facility] - Pap IG and HPV (high risk) DNA detection - US Pelvis Complete; Future - US Transvaginal Non-OB; Future  2. Constipation due to slow transit Continue Amitiza once a day Consider change to [place]  02/23/2016

## 2016-02-25 ENCOUNTER — Telehealth: Payer: Self-pay

## 2016-02-25 LAB — PAP IG AND HPV HIGH-RISK
HPV, high-risk: NEGATIVE
PAP SMEAR COMMENT: 0

## 2016-02-25 NOTE — Telephone Encounter (Signed)
Spoke with patient. Patient advised of all results and verbalized understanding. Will call back with any future questions or concerns. MAH  

## 2016-02-25 NOTE — Telephone Encounter (Signed)
-----   Message from Glean Hess, MD sent at 02/25/2016  4:38 PM EDT ----- Pap shows atypical (non-cancerous) cells with negative HPV testing.

## 2016-03-02 ENCOUNTER — Ambulatory Visit
Admission: RE | Admit: 2016-03-02 | Discharge: 2016-03-02 | Disposition: A | Discharge status: Home / Self Care | Payer: BC Managed Care – PPO | Source: Ambulatory Visit | Attending: Internal Medicine | Admitting: Internal Medicine

## 2016-03-02 ENCOUNTER — Other Ambulatory Visit: Payer: Self-pay | Admitting: Internal Medicine

## 2016-03-02 DIAGNOSIS — N950 Postmenopausal bleeding: Secondary | ICD-10-CM | POA: Diagnosis present

## 2016-03-03 ENCOUNTER — Telehealth: Payer: Self-pay

## 2016-03-03 NOTE — Telephone Encounter (Signed)
-----   Message from Glean Hess, MD sent at 03/02/2016 11:56 AM EDT ----- Pelvic US is completely normal.  Will refer to [facility] for further evaluation.

## 2016-03-03 NOTE — Telephone Encounter (Signed)
Spoke with patient. Patient advised of all results and verbalized understanding. Will call back with any future questions or concerns. MAH  

## 2016-03-30 ENCOUNTER — Ambulatory Visit (INDEPENDENT_AMBULATORY_CARE_PROVIDER_SITE_OTHER): Payer: BC Managed Care – PPO | Admitting: Obstetrics and Gynecology

## 2016-03-30 ENCOUNTER — Encounter: Payer: Self-pay | Admitting: Obstetrics and Gynecology

## 2016-03-30 VITALS — BP 138/86 | HR 66 | Ht 65.0 in | Wt 174.0 lb

## 2016-03-30 DIAGNOSIS — R87619 Unspecified abnormal cytological findings in specimens from cervix uteri: Secondary | ICD-10-CM | POA: Insufficient documentation

## 2016-03-30 DIAGNOSIS — N950 Postmenopausal bleeding: Secondary | ICD-10-CM | POA: Diagnosis not present

## 2016-03-30 NOTE — Progress Notes (Signed)
GYN ENCOUNTER NOTE  Subjective:       Lauren Barrera is a 55 y.o. [phone] female is here for gynecologic evaluation of the following issues:  1. Postmenopausal bleeding. 2.  AGUS Pap smear  1.  Episode of postmenopausal bleeding lasting approximately 6 days, period-like within the past month.  Patient had associated abdominal pelvic pain; workup demonstrated constipation; following stool evacuation, pain resolved.   Nonsmoker. No history of abnormal Pap smears. Married and monogamous for many years. No history of deep thrusting dyspareunia. No history of postcoital bleeding.  Ultrasound March 2017: Normal; endometrial stripe 2 mm Abnormal Pap smear March 2017: AGUS/negative HPV    Gynecologic History No LMP recorded. Patient is postmenopausal. Contraception: post menopausal status Last Pap: AGUS/negative HPV Last mammogram: 07/15/2015, BI-RADS 1  Obstetric History OB History  Gravida Para Term Preterm AB SAB TAB Ectopic Multiple Living  4 2 2  0 2 2 0 0 0 2    # Outcome Date GA Lbr Len/2nd Weight Sex Delivery Anes PTL Lv  4 Term 66    M Vag-Spont   Y  3 Term 1988    F Vag-Spont   Y  2 SAB           1 SAB               Past Medical History  Diagnosis Date  . Sinus congestion   . Anxiety   . Constipation     Past Surgical History  Procedure Laterality Date  . Dilation and curettage of uterus      x2  . Resection of endocrine tumor  2010    benign pancreatic lesion  . Breast excisional biopsy Bilateral 1976    Current Outpatient Prescriptions on File Prior to Visit  Medication Sig Dispense Refill  . FLUoxetine (PROZAC) 40 MG capsule TAKE 2 CAPSULE BY MOUTH EVERY DAY 180 capsule 3  . guaiFENesin (MUCINEX) 600 MG 12 hr tablet Take 600 mg by mouth 2 (two) times daily.    Marland Kitchen lubiprostone (AMITIZA) 24 MCG capsule Take 1 capsule (24 mcg total) by mouth 2 (two) times daily with a meal. 60 capsule 5  . Multiple Vitamins-Minerals (MULTIVITAMIN WITH MINERALS) tablet Take 1  tablet by mouth daily.     No current facility-administered medications on file prior to visit.    Allergies  Allergen Reactions  . Codeine Other (See Comments)    Other Reaction: causes prickly sensation    Social History   Social History  . Marital Status: Married    Spouse Name: N/A  . Number of Children: N/A  . Years of Education: N/A   Occupational History  . Not on file.   Social History Main Topics  . Smoking status: Never Smoker   . Smokeless tobacco: Never Used  . Alcohol Use: 0.0 oz/week    0 Standard drinks or equivalent per week     Comment: occasional  . Drug Use: No  . Sexual Activity: Yes    Birth Control/ Protection: Post-menopausal   Other Topics Concern  . Not on file   Social History Narrative    Family History  Problem Relation Age of Onset  . Diabetes Mother   . Heart disease Father   . Diabetes Father   . Sudden death Brother     age 35  . Breast cancer Maternal Aunt 60  . Breast cancer Paternal Aunt 88  . Ovarian cancer Neg Hx   . Colon cancer Neg Hx  The following portions of the patient's history were reviewed and updated as appropriate: allergies, current medications, past family history, past medical history, past social history, past surgical history and problem list.  Review of Systems Review of Systems - Negative except : Gastrointestinal ROS: positive for - abdominal pain and constipation Genito-Urinary ROS: positive for - postmenopausal bleeding  Objective:   BP 138/86 mmHg  Pulse 66  Ht 5\' 5"  (1.651 m)  Wt 174 lb (78.926 kg)  BMI 28.96 kg/m2 Physical exam-deferred   Assessment:   1. Atypical glandular cells of undetermined significance (AGUS) on cervical Pap smear ; negative HPV  2. PMB (postmenopausal bleeding); Normal pelvic ultrasound with a thin endometrial stripe     Plan:   1.  Return in 1 week for colposcopy with cervical biopsies/ECC, endometrial biopsy.  A total of 25 minutes were spent  face-to-face with the patient during this encounter and over half of that time involved counseling and coordination of care.  Brayton Mars, MD  Note: This dictation was prepared with Dragon dictation along with smaller phrase technology. Any transcriptional errors that result from this process are unintentional.

## 2016-03-30 NOTE — Patient Instructions (Addendum)
1. Return in 1 week for colposcopy with biopsies to assess abnormal Pap smear-AGUS 2.  Take 800 mg of ibuprofen prior to visit

## 2016-04-05 ENCOUNTER — Other Ambulatory Visit: Payer: Self-pay | Admitting: Internal Medicine

## 2016-04-05 MED ORDER — SCOPOLAMINE 1 MG/3DAYS TD PT72: 1.0000 | MEDICATED_PATCH | TRANSDERMAL | Status: DC

## 2016-04-08 ENCOUNTER — Encounter: Payer: BC Managed Care – PPO | Admitting: Obstetrics and Gynecology

## 2016-04-14 ENCOUNTER — Encounter: Payer: BC Managed Care – PPO | Admitting: Obstetrics and Gynecology

## 2016-04-14 ENCOUNTER — Ambulatory Visit (INDEPENDENT_AMBULATORY_CARE_PROVIDER_SITE_OTHER): Payer: BC Managed Care – PPO | Admitting: Obstetrics and Gynecology

## 2016-04-14 ENCOUNTER — Encounter: Payer: Self-pay | Admitting: Obstetrics and Gynecology

## 2016-04-14 VITALS — BP 94/61 | HR 87 | Ht 65.0 in | Wt 169.3 lb

## 2016-04-14 DIAGNOSIS — N950 Postmenopausal bleeding: Secondary | ICD-10-CM

## 2016-04-14 DIAGNOSIS — N882 Stricture and stenosis of cervix uteri: Secondary | ICD-10-CM

## 2016-04-14 DIAGNOSIS — R87619 Unspecified abnormal cytological findings in specimens from cervix uteri: Secondary | ICD-10-CM | POA: Diagnosis not present

## 2016-04-14 NOTE — Patient Instructions (Addendum)
1. Pap smear is done 2. Endocervical curettage is done 3. Cervical biopsy is done 4. Endometrial biopsy is done 5. Return in 10 days for follow-up Colposcopy Care After Colposcopy is a procedure in which a special tool is used to magnify the surface of the cervix. A tissue sample (biopsy) may also be taken. This sample will be looked at for cervical cancer or other problems. After the test:  You may have some cramping.  Lie down for a few minutes if you feel lightheaded.   You may have some bleeding which should stop in a few days. HOME CARE  Do not have sex or use tampons for 2 to 3 days or as told.  Only take medicine as told by your doctor.  Continue to take your birth control pills as usual. Finding out the results of your test Ask when your test results will be ready. Make sure you get your test results. GET HELP RIGHT AWAY IF:  You are bleeding a lot or are passing blood clots.  You develop a fever of 102 F (38.9 C) or higher.  You have abnormal vaginal discharge.  You have cramps that do not go away with medicine.  You feel lightheaded, dizzy, or pass out (faint). MAKE SURE YOU:   Understand these instructions.  Will watch your condition.  Will get help right away if you are not doing well or get worse.   This information is not intended to replace advice given to you by your health care provider. Make sure you discuss any questions you have with your health care provider.   Document Released: 05/10/2008 Document Revised: 02/14/2012 Document Reviewed: 06/21/2013 Elsevier Interactive Patient Education 2016 Elsevier Inc.   Endometrial Biopsy, Care After Refer to this sheet in the next few weeks. These instructions provide you with information on caring for yourself after your procedure. Your health care provider may also give you more specific instructions. Your treatment has been planned according to current medical practices, but problems sometimes occur.  Call your health care provider if you have any problems or questions after your procedure. WHAT TO EXPECT AFTER THE PROCEDURE After your procedure, it is typical to have the following:  You may have mild cramping and a small amount of vaginal bleeding for a few days after the procedure. This is normal. HOME CARE INSTRUCTIONS  Only take over-the-counter or prescription medicine as directed by your health care provider.  Do not douche, use tampons, or have sexual intercourse until your health care provider approves.  Follow your health care provider's instructions regarding any activity restrictions, such as strenuous exercise or heavy lifting. SEEK MEDICAL CARE IF:  You have heavy bleeding or bleeding longer than 2 days after the procedure.  You have bad smelling drainage from your vagina.  You have a fever and chills.  Youhave severe lower stomach (abdominal) pain. SEEK IMMEDIATE MEDICAL CARE IF:  You have severe cramps in your stomach or back.  You pass large blood clots.  Your bleeding increases.  You become weak or lightheaded, or you pass out.   This information is not intended to replace advice given to you by your health care provider. Make sure you discuss any questions you have with your health care provider.   Document Released: 09/12/2013 Document Reviewed: 09/12/2013 Elsevier Interactive Patient Education [employer].

## 2016-04-16 LAB — PATHOLOGY

## 2016-04-19 LAB — PATHOLOGY

## 2016-04-20 LAB — PAP IG AND HPV HIGH-RISK
HPV, HIGH-RISK: NEGATIVE
PAP Smear Comment: 0

## 2016-04-26 NOTE — Progress Notes (Signed)
Chief complaint: 1. AGUS Pap smear, negative HPV 2. Postmenopausal bleeding 3. Normal pelvic ultrasound with a thin endometrial stripe  Patient presents for colposcopy and endometrial biopsy.  PROCEDURE: Colposcopy of upper adjacent vagina and cervix with biopsies; endometrial biopsy Colposcopy Description: Patient was placed in the dorsal lithotomy position. Speculum was placed in the vagina. The vagina and cervix were painted with acetic acid solution and colposcopy was performed. Findings: SCJ is not visualized; possible abnormal vessel at 12:00; otherwise normal findings of cervix and vagina. Lacrimal duct probes were used to dilate the endocervical canal. Biopsies:  ECC  Cervix biopsy 12:00 Monsel solution is applied for hemostasis at biopsy sites  Endometrial Biopsy Procedure Note  Pre-operative Diagnosis: AGUS Pap smear; negative HPV; postmenopausal bleeding; abnormal pelvic ultrasound with an endometrial stripe  Post-operative Diagnosis: Same  Indications: Same  Procedure Details   Urine pregnancy test was not done.  The risks (including infection, bleeding, pain, and uterine perforation) and benefits of the procedure were explained to the patient and Verbal informed consent was obtained.  Antibiotic prophylaxis against endocarditis was not indicated.   The patient was placed in the dorsal lithotomy position.  Bimanual exam showed the uterus to be in the neutral position.  A Graves' speculum inserted in the vagina, and the cervix prepped with povidone iodine.  Endocervical curettage with a Kevorkian curette was not performed.   A sharp tenaculum was applied to the anterior lip of the cervix for stabilization. 3 mm pipette was used to sound the uterus to a depth of 8cm.  A Mylex 28mm curette was used to sample the endometrium.  Sample was sent for pathologic examination.  Condition: Stable  Complications: None  Plan:  The patient was advised to call for any fever or for  prolonged or severe pain or bleeding. She was advised to use OTC acetaminophen and OTC ibuprofen as needed for mild to moderate pain. She was advised to avoid vaginal intercourse for 48 hours or until the bleeding has completely stopped.  Attending Physician Documentation: Brayton Mars, MD  ASSESSMENT: 1. AGUS Pap smear, negative HPV 2. Postmenopausal bleeding 3. Normal pelvic ultrasound with a thin endometrial stripe 4. Adequate colposcopy with ECC, and cervical biopsy taken at 12:00 5. Endometrial biopsy completed 6. Postprocedure instructions were given 7. Return in 2 weeks for follow-up and further management planning  Brayton Mars, MD  Note: This dictation was prepared with Dragon dictation along with smaller phrase technology. Any transcriptional errors that result from this process are unintentional.

## 2016-04-29 ENCOUNTER — Encounter: Payer: Self-pay | Admitting: Obstetrics and Gynecology

## 2016-04-29 ENCOUNTER — Ambulatory Visit (INDEPENDENT_AMBULATORY_CARE_PROVIDER_SITE_OTHER): Payer: BC Managed Care – PPO | Admitting: Obstetrics and Gynecology

## 2016-04-29 VITALS — BP 117/78 | HR 74 | Ht 65.0 in | Wt 170.4 lb

## 2016-04-29 DIAGNOSIS — N870 Mild cervical dysplasia: Secondary | ICD-10-CM | POA: Insufficient documentation

## 2016-04-29 DIAGNOSIS — R87613 High grade squamous intraepithelial lesion on cytologic smear of cervix (HGSIL): Secondary | ICD-10-CM | POA: Diagnosis not present

## 2016-04-29 DIAGNOSIS — R87619 Unspecified abnormal cytological findings in specimens from cervix uteri: Secondary | ICD-10-CM

## 2016-04-29 NOTE — Patient Instructions (Signed)
1. LEEP cone biopsy scheduled for 05/24/2016  2. Return for preoperative appointment on 05/20/2016.

## 2016-04-29 NOTE — Progress Notes (Signed)
Chief complaint: 1. AGUS Pap smear  Patient presents for a conference to discuss ongoing workup of AGUS Pap smear. Endometrial biopsy was benign. ECC was negative. 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 Repeat Pap smear demonstrated high-grade squamous intraepithelial lesion with a negative high risk HPV  ASSESSMENT: 1. AGUS Pap smear, unexplained 2. HSIL Pap smear, unexplained 3. 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 Endometrial biopsy benign  PLAN: 1. LEEP cone biopsy scheduled for 05/24/2014 2. Patient is to return on 05/20/2018 for preop appointment  A total of 15 minutes were spent face-to-face with the patient during this encounter and over half of that time dealt with counseling and coordination of care.  Brayton Mars, MD  Note: This dictation was prepared with Dragon dictation along with smaller phrase technology. Any transcriptional errors that result from this process are unintentional.

## 2016-05-20 ENCOUNTER — Encounter: Payer: Self-pay | Admitting: Obstetrics and Gynecology

## 2016-05-20 ENCOUNTER — Encounter: Payer: BC Managed Care – PPO | Admitting: Obstetrics and Gynecology

## 2016-05-24 ENCOUNTER — Telehealth: Payer: Self-pay | Admitting: Obstetrics and Gynecology

## 2016-05-26 ENCOUNTER — Encounter: Payer: Self-pay | Admitting: Obstetrics and Gynecology

## 2016-05-26 ENCOUNTER — Encounter
Admission: RE | Admit: 2016-05-26 | Discharge: 2016-05-26 | Disposition: A | Discharge status: Home / Self Care | Payer: BC Managed Care – PPO | Source: Ambulatory Visit | Attending: Obstetrics and Gynecology | Admitting: Obstetrics and Gynecology

## 2016-05-26 ENCOUNTER — Ambulatory Visit (INDEPENDENT_AMBULATORY_CARE_PROVIDER_SITE_OTHER): Payer: BC Managed Care – PPO | Admitting: Obstetrics and Gynecology

## 2016-05-26 VITALS — BP 117/75 | HR 84 | Ht 65.0 in | Wt 170.4 lb

## 2016-05-26 DIAGNOSIS — Z01812 Encounter for preprocedural laboratory examination: Secondary | ICD-10-CM | POA: Insufficient documentation

## 2016-05-26 DIAGNOSIS — N870 Mild cervical dysplasia: Secondary | ICD-10-CM

## 2016-05-26 DIAGNOSIS — R87619 Unspecified abnormal cytological findings in specimens from cervix uteri: Secondary | ICD-10-CM

## 2016-05-26 DIAGNOSIS — Z01818 Encounter for other preprocedural examination: Secondary | ICD-10-CM

## 2016-05-26 DIAGNOSIS — N950 Postmenopausal bleeding: Secondary | ICD-10-CM

## 2016-05-26 DIAGNOSIS — R87613 High grade squamous intraepithelial lesion on cytologic smear of cervix (HGSIL): Secondary | ICD-10-CM

## 2016-05-26 LAB — CBC WITH DIFFERENTIAL/PLATELET
Basophils Absolute: 0.1 10*3/uL (ref 0–0.1)
Basophils Relative: 1 %
Eosinophils Absolute: 0.1 10*3/uL (ref 0–0.7)
Eosinophils Relative: 1 %
HEMATOCRIT: 40.5 % (ref 35.0–47.0)
HEMOGLOBIN: 13.9 g/dL (ref 12.0–16.0)
LYMPHS ABS: 1.7 10*3/uL (ref 1.0–3.6)
LYMPHS PCT: 18 %
MCH: 31.2 pg (ref 26.0–34.0)
MCHC: 34.3 g/dL (ref 32.0–36.0)
MCV: 91.1 fL (ref 80.0–100.0)
MONOS PCT: 11 %
Monocytes Absolute: 1.1 10*3/uL — ABNORMAL HIGH (ref 0.2–0.9)
NEUTROS ABS: 6.8 10*3/uL — AB (ref 1.4–6.5)
NEUTROS PCT: 69 %
Platelets: 252 10*3/uL (ref 150–440)
RBC: 4.44 MIL/uL (ref 3.80–5.20)
RDW: 12.9 % (ref 11.5–14.5)
WBC: 9.8 10*3/uL (ref 3.6–11.0)

## 2016-05-26 LAB — RAPID HIV SCREEN (HIV 1/2 AB+AG)
HIV 1/2 Antibodies: NONREACTIVE
HIV-1 P24 ANTIGEN - HIV24: NONREACTIVE

## 2016-05-26 NOTE — H&P (Signed)
Subjective:  PREOPERATIVE HISTORY AND PHYSICAL     Date of surgery: 06/14/2016 Chief complaint: 1. AGUS Pap smear, unexplained   Patient is a 55 y.o. G4P2065female scheduled for LEEP cone biopsy for evaluation of AGUS Pap smear, unexplained.  Preoperative workup includes: 1. AGUS Pap smear, unexplained 2. HSIL Pap smear, unexplained 3. 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 Endometrial biopsy benign-disordered proliferative phase endometrium without hyperplasia or carcinoma 5. ECC benign-limited fragments of benign endocervical glands mucus and blood  Pelvic ultrasound from 03/02/2016:  FINDINGS: Uterus  Measurements: 6 point at at 3.6 x 5.7 cm. No fibroids or other mass visualized.  Endometrium  Thickness: 2 mm. No focal abnormality visualized.  Right ovary  Measurements: 2.1 x 1.1 x 1.6 cm. Normal appearance/no adnexal mass.  Left ovary  Measurements: 2.2 x 0.9 x 2.1 cm. Normal appearance/no adnexal mass.  Other findings  No abnormal free fluid.  IMPRESSION: No acute abnormality noted.    Menstrual History: OB History    Gravida Para Term Preterm AB TAB SAB Ectopic Multiple Living   4 2 2  0 2 0 2 0 0 2      Menarche age: NA No LMP recorded. Patient is postmenopausal.    Past Medical History  Diagnosis Date  . Sinus congestion   . Anxiety   . Constipation     Past Surgical History  Procedure Laterality Date  . Dilation and curettage of uterus      x2  . Resection of endocrine tumor  2010    benign pancreatic lesion  . Breast excisional biopsy Bilateral 1976  . Shoulder arthroscopy w/ rotator cuff repair Right     OB History  Gravida Para Term Preterm AB SAB TAB Ectopic Multiple Living  4 2 2  0 2 2 0 0 0 2    # Outcome Date GA Lbr Len/2nd Weight Sex Delivery Anes PTL Lv  4 Term 72    M Vag-Spont   Y  3 Term 1988    F Vag-Spont   Y  2 SAB           1 SAB               Social History   Social History  . Marital  Status: Married    Spouse Name: N/A  . Number of Children: N/A  . Years of Education: N/A   Social History Main Topics  . Smoking status: Never Smoker   . Smokeless tobacco: Never Used  . Alcohol Use: 0.0 oz/week    0 Standard drinks or equivalent per week     Comment: occasional  . Drug Use: No  . Sexual Activity: Yes    Birth Control/ Protection: Post-menopausal   Other Topics Concern  . None   Social History Narrative    Family History  Problem Relation Age of Onset  . Diabetes Mother   . Heart disease Father   . Diabetes Father   . Sudden death Brother     age 64  . Breast cancer Maternal Aunt 80  . Breast cancer Paternal Aunt 36  . Ovarian cancer Neg Hx   . Colon cancer Neg Hx      (Not in a hospital admission)  Allergies  Allergen Reactions  . Codeine Other (See Comments)    Other Reaction: causes prickly sensation    Review of Systems Constitutional: No recent fever/chills/sweats Respiratory: No recent cough/bronchitis Cardiovascular: No chest pain Gastrointestinal: No recent nausea/vomiting/diarrhea Genitourinary: No UTI symptoms Hematologic/lymphatic:No  history of coagulopathy or recent blood thinner use    Objective:    BP 117/75 mmHg  Pulse 84  Ht 5\' 5"  (1.651 m)  Wt 170 lb 6.4 oz (77.293 kg)  BMI 28.36 kg/m2  General:   Normal  Skin:   normal  HEENT:  Normal  Neck:  Supple without Adenopathy or Thyromegaly  Lungs:   Heart:              Breasts:   Abdomen:  Pelvis:  M/S   Extremeties:  Neuro:    clear to auscultation bilaterally   Normal without murmur   Not Examined   soft, non-tender; bowel sounds normal; no masses,  no organomegaly   Exam deferred to OR  No CVAT  Warm/Dry   Normal          Assessment:    AGUS Pap smear, unexplained Benign endometrial biopsy; benign ECC; colposcopic directed biopsies consistent with CIN-1 Ultrasound normal   Plan:   LEEP cone biopsy of the cervix is scheduled for  06/14/2016.  Preoperative counseling: Patient is to undergo repeat cold biopsy for unexplained abnormal Pap smear-AGUS. She is understanding of the planned procedure and is aware of and is accepting of all surgical risks which include but are not limited to bleeding, infection, pelvic organ injury with need for repair, blood clot disorders, anesthesia risks, etc. All questions have been answered. Informed consent is given. Patient is ready and willing to proceed with surgery as scheduled.  Brayton Mars, MD  Note: This dictation was prepared with Dragon dictation along with smaller phrase technology. Any transcriptional errors that result from this process are unintentional.

## 2016-05-26 NOTE — Patient Instructions (Signed)
  Your procedure is scheduled on: 06/14/16 Mon Report to Same Day Surgery 2nd floor medical mall To find out your arrival time please call [phone] between 1PM - 3PM on 06/11/16 Fri  Remember: Instructions that are not followed completely may result in serious medical risk, up to and including death, or upon the discretion of your surgeon and anesthesiologist your surgery may need to be rescheduled.    _x___ 1. Do not eat food or drink liquids after midnight. No gum chewing or hard candies.     __x__ 2. No Alcohol for 24 hours before or after surgery.   __x__3. No Smoking for 24 prior to surgery.   ____  4. Bring all medications with you on the day of surgery if instructed.    __x__ 5. Notify your doctor if there is any change in your medical condition     (cold, fever, infections).     Do not wear jewelry, make-up, hairpins, clips or nail polish.  Do not wear lotions, powders, or perfumes. You may wear deodorant.  Do not shave 48 hours prior to surgery. Men may shave face and neck.  Do not bring valuables to the hospital.    [facility] is not responsible for any belongings or valuables.               Contacts, dentures or bridgework may not be worn into surgery.  Leave your suitcase in the car. After surgery it may be brought to your room.  For patients admitted to the hospital, discharge time is determined by your treatment team.   Patients discharged the day of surgery will not be allowed to drive home.    Please read over the following fact sheets that you were given:   [facility] Preparing for Surgery and or MRSA Information   _x___ Take these medicines the morning of surgery with A SIP OF WATER:    1. FLUoxetine (PROZAC) 40 MG capsule  2.  3.  4.  5.  6.  ____ Fleet Enema (as directed)   _x___ Use CHG Soap or sage wipes as directed on instruction sheet   ____ Use inhalers on the day of surgery and bring to hospital day of surgery  ____ Stop metformin 2 days  prior to surgery    ____ Take 1/2 of usual insulin dose the night before surgery and none on the morning of           surgery.   ____ Stop aspirin or coumadin, or plavix  _x__ Stop Anti-inflammatories such as Advil, Aleve, Ibuprofen, Motrin, Naproxen,          Naprosyn, Goodies powders or aspirin products. Ok to take Tylenol.   ____ Stop supplements until after surgery.    ____ Bring C-Pap to the hospital.

## 2016-05-26 NOTE — Patient Instructions (Signed)
1. Return in 1 week postop for postop check 2. Surgery is changed to 06/14/2016

## 2016-05-26 NOTE — Progress Notes (Signed)
Subjective:  PREOPERATIVE HISTORY AND PHYSICAL     Date of surgery: 06/14/2016 Chief complaint: 1. AGUS Pap smear, unexplained   Patient is a 55 y.o. G4P2053female scheduled for LEEP cone biopsy for evaluation of AGUS Pap smear, unexplained.  Preoperative workup includes: 1. AGUS Pap smear, unexplained 2. HSIL Pap smear, unexplained 3. 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 Endometrial biopsy benign-disordered proliferative phase endometrium without hyperplasia or carcinoma 5. ECC benign-limited fragments of benign endocervical glands mucus and blood  Pelvic ultrasound from 03/02/2016:  FINDINGS: Uterus  Measurements: 6 point at at 3.6 x 5.7 cm. No fibroids or other mass visualized.  Endometrium  Thickness: 2 mm. No focal abnormality visualized.  Right ovary  Measurements: 2.1 x 1.1 x 1.6 cm. Normal appearance/no adnexal mass.  Left ovary  Measurements: 2.2 x 0.9 x 2.1 cm. Normal appearance/no adnexal mass.  Other findings  No abnormal free fluid.  IMPRESSION: No acute abnormality noted.    Menstrual History: OB History    Gravida Para Term Preterm AB TAB SAB Ectopic Multiple Living   4 2 2  0 2 0 2 0 0 2      Menarche age: NA No LMP recorded. Patient is postmenopausal.    Past Medical History  Diagnosis Date  . Sinus congestion   . Anxiety   . Constipation     Past Surgical History  Procedure Laterality Date  . Dilation and curettage of uterus      x2  . Resection of endocrine tumor  2010    benign pancreatic lesion  . Breast excisional biopsy Bilateral 1976  . Shoulder arthroscopy w/ rotator cuff repair Right     OB History  Gravida Para Term Preterm AB SAB TAB Ectopic Multiple Living  4 2 2  0 2 2 0 0 0 2    # Outcome Date GA Lbr Len/2nd Weight Sex Delivery Anes PTL Lv  4 Term 37    M Vag-Spont   Y  3 Term 1988    F Vag-Spont   Y  2 SAB           1 SAB               Social History   Social History  . Marital  Status: Married    Spouse Name: N/A  . Number of Children: N/A  . Years of Education: N/A   Social History Main Topics  . Smoking status: Never Smoker   . Smokeless tobacco: Never Used  . Alcohol Use: 0.0 oz/week    0 Standard drinks or equivalent per week     Comment: occasional  . Drug Use: No  . Sexual Activity: Yes    Birth Control/ Protection: Post-menopausal   Other Topics Concern  . None   Social History Narrative    Family History  Problem Relation Age of Onset  . Diabetes Mother   . Heart disease Father   . Diabetes Father   . Sudden death Brother     age 38  . Breast cancer Maternal Aunt 70  . Breast cancer Paternal Aunt 44  . Ovarian cancer Neg Hx   . Colon cancer Neg Hx      (Not in a hospital admission)  Allergies  Allergen Reactions  . Codeine Other (See Comments)    Other Reaction: causes prickly sensation    Review of Systems Constitutional: No recent fever/chills/sweats Respiratory: No recent cough/bronchitis Cardiovascular: No chest pain Gastrointestinal: No recent nausea/vomiting/diarrhea Genitourinary: No UTI symptoms Hematologic/lymphatic:No  history of coagulopathy or recent blood thinner use    Objective:    BP 117/75 mmHg  Pulse 84  Ht 5\' 5"  (1.651 m)  Wt 170 lb 6.4 oz (77.293 kg)  BMI 28.36 kg/m2  General:   Normal  Skin:   normal  HEENT:  Normal  Neck:  Supple without Adenopathy or Thyromegaly  Lungs:   Heart:              Breasts:   Abdomen:  Pelvis:  M/S   Extremeties:  Neuro:    clear to auscultation bilaterally   Normal without murmur   Not Examined   soft, non-tender; bowel sounds normal; no masses,  no organomegaly   Exam deferred to OR  No CVAT  Warm/Dry   Normal          Assessment:    AGUS Pap smear, unexplained Benign endometrial biopsy; benign ECC; colposcopic directed biopsies consistent with CIN-1 Ultrasound normal   Plan:   LEEP cone biopsy of the cervix is scheduled for  06/14/2016.  Preoperative counseling: Patient is to undergo repeat cold biopsy for unexplained abnormal Pap smear-AGUS. She is understanding of the planned procedure and is aware of and is accepting of all surgical risks which include but are not limited to bleeding, infection, pelvic organ injury with need for repair, blood clot disorders, anesthesia risks, etc. All questions have been answered. Informed consent is given. Patient is ready and willing to proceed with surgery as scheduled.  Brayton Mars, MD  Note: This dictation was prepared with Dragon dictation along with smaller phrase technology. Any transcriptional errors that result from this process are unintentional.

## 2016-05-26 NOTE — Pre-Procedure Instructions (Signed)
Dr Enzo Bi request IV sedation and he will do a paracervical block.

## 2016-05-27 LAB — RPR: RPR Ser Ql: NONREACTIVE

## 2016-06-10 ENCOUNTER — Encounter: Payer: Self-pay | Admitting: Physical Therapy

## 2016-06-10 ENCOUNTER — Ambulatory Visit: Payer: BC Managed Care – PPO | Attending: Orthopedic Surgery

## 2016-06-10 DIAGNOSIS — M6281 Muscle weakness (generalized): Secondary | ICD-10-CM | POA: Diagnosis present

## 2016-06-10 DIAGNOSIS — M25511 Pain in right shoulder: Secondary | ICD-10-CM | POA: Insufficient documentation

## 2016-06-10 DIAGNOSIS — M25611 Stiffness of right shoulder, not elsewhere classified: Secondary | ICD-10-CM | POA: Diagnosis not present

## 2016-06-10 NOTE — Therapy (Signed)
[facility] [address]. [place], [place], [address] Phone: [phone]   Fax:  [phone]  Physical Therapy Evaluation  Patient Details  Name: Lauren Barrera MRN: [id number] Date of Birth: 07/05/1961 Referring Provider: Roselee Culver  Encounter Date: 06/10/2016      PT End of Session - 06/10/16 1500    Visit Number 1   Number of Visits 17   Date for PT Re-Evaluation 08/05/16   Authorization Type no g codes   PT Start Time [id number]   PT Stop Time 1530   PT Time Calculation (min) 65 min   Activity Tolerance Patient tolerated treatment well   Behavior During Therapy [facility] for tasks assessed/performed      Past Medical History  Diagnosis Date  . Sinus congestion   . Anxiety   . Constipation     Past Surgical History  Procedure Laterality Date  . Dilation and curettage of uterus      x2  . Resection of endocrine tumor  2010    benign pancreatic lesion  . Breast excisional biopsy Bilateral 1976  . Shoulder arthroscopy w/ rotator cuff repair Right     There were no vitals filed for this visit.       Subjective Assessment - 06/10/16 1454    Subjective S/p R RTC repair, subacromial decompression and biceps tendonitis   Pertinent History Pt underwent s/p R RTC repair, subacromial decompression and biceps tendonitis (no indication of tenodesis) on 05/17/16. No information regarding how many tendons were repaired however MRI from before surgery reports full thickness anterior supraspinatus tear as well as partial articular surface anterior infraspinatus tear. She reports compliance with use of sling since surgery. She has not been icing. Has been able to disocontinue use of prescription pain medications. Pt reports compliance with exercises since surgery that she was provided within limitations of protocol    Diagnostic tests Before surgery: MRI reports full thickness anterior supraspinatus tear as well as partial articular surface  anterior infraspinatus tear, mild subscapularis tendinosis, superior labral fraying with mild tra-articular long head bicept tendinosis. Small joint effusion, synovitis, and mild subacromial/subdeltoid bursitis   Patient Stated Goals Return to driving, work, and yoga   Currently in Pain? Yes   Pain Score 1   Worst: 4/10, Best: 0/10 with pain medication   Pain Location Shoulder   Pain Orientation Right  Deep in the joint   Pain Descriptors / Indicators Heaviness   Pain Type Surgical pain   Pain Radiating Towards None   Pain Onset 1 to 4 weeks ago   Pain Frequency Constant   Aggravating Factors  Not applicable   Pain Relieving Factors Hot shower, pain meds, shoulder immobilizer            [facility] PT Assessment - 06/10/16 1430    Assessment   Medical Diagnosis R RTC repair, SAD, and bicep tendonitis   Referring Provider Roselee Culver   Onset Date/Surgical Date 05/17/16   Hand Dominance Right   Next MD Visit Week of 06/14/16, unclear of day   Prior Therapy None   Precautions   Precautions Shoulder   Type of Shoulder Precautions As described in protocol   Shoulder Interventions Shoulder abduction pillow;Shoulder sling/immobilizer   Precaution Booklet Issued --  Pt already with booklet from orthopedics   Required Braces or Orthoses Sling   Restrictions   Weight Bearing Restrictions Yes   RUE Weight Bearing Non weight bearing   Balance Screen   Has  the patient fallen in the past 6 months Yes   How many times? 2   Has the patient had a decrease in activity level because of a fear of falling?  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    Posture/Postural Control   Posture Comments Pt in R shoulder sling with abduction pillow. Otherwise no gross abnormalities in posture noted   ROM / Strength   AROM / PROM / Strength PROM;Strength   PROM   Overall PROM Comments R shoulder flexion to 104 and ER to 25 PROM, pain limited. Not pushed into resistance. Pt with notable muscle guarding/spasms during PROM. Has difficulty relaxing. Avoided PROM into scaption or IR due to limitations in protocol and no information regarding which tendons were repaired. R elbow and wrist PROM appears grossly WFL. Pt with some stretching of biceps with elbow extension but able to achieve full extension. LUE AROM appears grossly [facility]   Strength   Overall Strength Comments LUE strength is grossly WFL without notable deficits. IR/ER not assessed in LUE. Deferred strength testing in R shoulder or elbow due to surgical precautions. Wrist flexion/extension, forearm pronation/supination, and grip strength appears grossly WFL and symmetrical to LUE   Palpation   Palpation comment Pt with mild tenderness to palpation around R shoulder and scars. Arthroscopic scars are well-healed without erythemia, ecchymosis, or edema noted. Pt with some decreased scar mobility noted, especially anteriorly.    Transfers   Comments Independent   Ambulation/Gait   Gait Comments Independent, RUE immobilized       TREATMENT  Performed PROM R shoulder to pain-limited end range, not progressed into resistance 5 sec hold x 10 for flexion and external  rotation; Gentle STM to anterior, middle, and posterior deltoid with scar massage over incisions. Pt educated and instructed about how to perform scar massage; Seated scapular retractions x 5; Seated R elbow PROM extension; Pt issued written HEP with extensive instructions provided;                     PT Education - 06/10/16 1500    Education provided Yes   Education Details HEP, precuations   Person(s) Educated Patient   Methods Explanation;Demonstration   Comprehension Verbalized understanding;Returned demonstration             PT Long Term Goals - 06/10/16 1627    PT LONG TERM GOAL #1   Title Pt will be independent with HEP in order to improve strength and ROM as well as decrease pain   Time 8   Period Weeks   Status New   PT LONG TERM GOAL #2   Title Pt  will decrease DASH by at least 8 points demonstrating clinically significant reduction in shoulder disability    Baseline 06/10/16: 100/100   Time 8   Period Weeks   Status New   PT LONG TERM GOAL #3   Title Pt will report worst pain on NPRS as 1/10 in order to demonstrate significant reduction in R shoulder pain    Baseline 06/10/16: worst: 4/10   Time 8   Period Weeks   Status New   PT LONG TERM GOAL #4   Title Pt will increase R shoulder AROM so that it is symmetrical in all planes to LUE in order to perform ADLs such as washing/combing hair   Baseline 06/10/16: unable to test due to protocol restrictions   Time 8   Period Weeks   Status New   PT LONG TERM GOAL #5   Title Pt will increase R shoulder flexion, abduction, extension, IR, and ER strength to at least 4-/5 in order to improve function at home and work   Baseline 06/10/16: unable to test due to protocol restrictions   Time 12   Period Weeks   Status New               Plan - 06/10/16 1617    Clinical Impression Statement Pt is a pleasant 55 yo female 3.5 weeks s/p R RTC repair as well as subacromial decompression. Order also states  that pt with biceps tendonitis. No indication that tenodesis was performed. Pt has been maintaining RUE in sling as instructed by orthopedic surgeon. She demonstrates 104 degrees PROM flexion and 25 degrees PROM ER without resistance. PROM limited by pain currently as well protocol restrictions. No strength testing of R elbow flexion/extension due to restrictions but R forearm pronation/supination, R wrist, and R grip strength appears grossly WFL and symmetrical to LUE. Sensation intact to entire RUE and scars appear to be healing well. Pt reports well controlled pain currently. with worst pain reported as 4/10 on NPRS. Quick DASH 100/100 currently representing complete disability of RUE. Pt issued HEP within protocol restrictions. Pt will benefit from skilled PT services to address deficts in R shoulder ROM, pain, and strength in order to return to full function at home and work.    Rehab Potential Excellent   Clinical Impairments Affecting Rehab Potential Positive: motivation, age, non-smoker; Negative: extent of repair   PT Frequency 2x / week   PT Duration 8 weeks   PT Treatment/Interventions ADLs/Self Care Home Management;Aquatic Therapy;Electrical Stimulation;Cryotherapy;Iontophoresis 4mg /ml Dexamethasone;Moist Heat;Traction;Ultrasound;Gait training;Therapeutic activities;Therapeutic exercise;Neuromuscular re-education;Patient/family education;Manual techniques;Dry needling;Passive range of motion;Taping   PT Next Visit Plan Progress PROM, initiate AAROM (per protocol week 4 wand/pulley), pain-free isometrics flexion/extension/ER/IR pain-free (OK at week 4 per protocol)   PT Home Exercise Plan Seated scapular retractions, PROM R elbow extension stretch, dead-arm pendulum hand (no motion per protocol)   Consulted and Agree with Plan of Care Patient      Patient will benefit from skilled therapeutic intervention in order to improve the following deficits and impairments:  Decreased range of motion,  Decreased scar mobility, Decreased strength, Increased muscle spasms, Pain  Visit Diagnosis: Stiffness of right shoulder, not elsewhere classified - Plan: PT plan of care cert/re-cert  Pain in right shoulder - Plan: PT plan of care cert/re-cert  Muscle weakness (generalized) - Plan: PT plan of care cert/re-cert     Problem List Patient Active Problem List   Diagnosis Date Noted  . Dysplasia of cervix, low grade (CIN 1) 04/29/2016  .  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 Atypical glandular cells of undetermined significance (AGUS) on cervical Pap smear 03/30/2016  . PMB (postmenopausal bleeding) 03/30/2016  . Post-menopausal bleeding 02/23/2016  . Constipation due to slow transit 02/23/2016  . Hyperlipidemia 06/23/2015  . 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 Anxiety and depression 05/26/2015  . ADD (attention deficit disorder) 26-May-2015  . Allergic state 05/26/15  . Family history of sudden death 05/26/2015  . Bloodgood disease 2015-05-26  . Chronic interstitial cystitis 05/26/2015  . Hypochromic microcytic anemia 2015-05-26  . Obstructive sleep apnea of adult 2015-05-26  . Apnea, sleep 10/04/2012   Phillips Grout PT, DPT   Lauren Barrera 06/10/2016, 4:40 PM  [facility] [address] [place], [place], [address] Phone: [phone]   Fax:  [phone]  Name: Lauren Barrera MRN: [id number] Date of Birth: 1961-11-03

## 2016-06-10 NOTE — Patient Instructions (Signed)
Pendulum Static Hang    Bend forward 90 at waist, using table for support. Let arm hang in static position. Do not move. Hold for up to 1 minute at a time. Repeat 3-5 times, 2 times/day.   Scapular Retraction (Standing)    With R arm in sling and L arm at side, pinch shoulder blades together. Hold for 3-5 seconds Repeat __10__ times per set. Do _2___ sets per session. Do __2__ sessions per day.   Elbow: Extension    Grasp forearm with other hand and use a steady downward and outward pull to straighten elbow. Hold __10 seconds and then bend all the way back to fully flexed position. Repeat _10___ times. Do __2__ sessions per day. CAUTION: Stretch slowly and gently. Do not force joint. Do not pull on the shoulder.    Use putty or ball for hand and grip strength. Use soup can or water bottle for wrist flexion/extension strengthening as well as forearm rotation. Actively bend and straighten elbow but do not use any weight.   OK to use recumbent bike or walk on treadmill. No running or jogging.

## 2016-06-14 ENCOUNTER — Ambulatory Visit: Payer: BC Managed Care – PPO | Admitting: Anesthesiology

## 2016-06-14 ENCOUNTER — Ambulatory Visit
Admission: RE | Admit: 2016-06-14 | Discharge: 2016-06-14 | Disposition: A | Discharge status: Home / Self Care | Payer: BC Managed Care – PPO | Source: Ambulatory Visit | Attending: Obstetrics and Gynecology | Admitting: Obstetrics and Gynecology

## 2016-06-14 ENCOUNTER — Encounter
Admission: RE | Disposition: A | Discharge status: Home / Self Care | Payer: Self-pay | Source: Ambulatory Visit | Attending: Obstetrics and Gynecology

## 2016-06-14 DIAGNOSIS — R87613 High grade squamous intraepithelial lesion on cytologic smear of cervix (HGSIL): Secondary | ICD-10-CM | POA: Diagnosis not present

## 2016-06-14 DIAGNOSIS — N870 Mild cervical dysplasia: Secondary | ICD-10-CM

## 2016-06-14 DIAGNOSIS — N950 Postmenopausal bleeding: Secondary | ICD-10-CM | POA: Insufficient documentation

## 2016-06-14 DIAGNOSIS — G4730 Sleep apnea, unspecified: Secondary | ICD-10-CM | POA: Insufficient documentation

## 2016-06-14 DIAGNOSIS — N888 Other specified noninflammatory disorders of cervix uteri: Secondary | ICD-10-CM | POA: Diagnosis not present

## 2016-06-14 DIAGNOSIS — R87619 Unspecified abnormal cytological findings in specimens from cervix uteri: Secondary | ICD-10-CM | POA: Diagnosis not present

## 2016-06-14 DIAGNOSIS — Z862 Personal history of diseases of the blood and blood-forming organs and certain disorders involving the immune mechanism: Secondary | ICD-10-CM | POA: Insufficient documentation

## 2016-06-14 DIAGNOSIS — F419 Anxiety disorder, unspecified: Secondary | ICD-10-CM | POA: Insufficient documentation

## 2016-06-14 DIAGNOSIS — R87612 Low grade squamous intraepithelial lesion on cytologic smear of cervix (LGSIL): Secondary | ICD-10-CM | POA: Diagnosis not present

## 2016-06-14 HISTORY — PX: LEEP: SHX91

## 2016-06-14 SURGERY — LEEP (LOOP ELECTROSURGICAL EXCISION PROCEDURE)
Anesthesia: General | Wound class: Clean Contaminated

## 2016-06-14 MED ORDER — IODINE STRONG (LUGOLS) 5 % PO SOLN
ORAL | Status: DC | PRN
  Administered 2016-06-14: 0.1 mL via ORAL

## 2016-06-14 MED ORDER — IODINE STRONG (LUGOLS) 5 % PO SOLN
ORAL | Status: AC
  Filled 2016-06-14: qty 1

## 2016-06-14 MED ORDER — PROPOFOL 500 MG/50ML IV EMUL
INTRAVENOUS | Status: DC | PRN
  Administered 2016-06-14: 75 ug/kg/min via INTRAVENOUS

## 2016-06-14 MED ORDER — OXYCODONE-ACETAMINOPHEN 5-325 MG PO TABS: 1.0000 | ORAL_TABLET | ORAL | Status: DC | PRN

## 2016-06-14 MED ORDER — MIDAZOLAM HCL 2 MG/2ML IJ SOLN
INTRAMUSCULAR | Status: DC | PRN
  Administered 2016-06-14 (×2): 1 mg via INTRAVENOUS

## 2016-06-14 MED ORDER — FAMOTIDINE 20 MG PO TABS
ORAL_TABLET | ORAL | Status: AC
  Administered 2016-06-14: 20 mg via ORAL
  Filled 2016-06-14: qty 1

## 2016-06-14 MED ORDER — IBUPROFEN 800 MG PO TABS: 800.0000 mg | ORAL_TABLET | Freq: Three times a day (TID) | ORAL | Status: DC

## 2016-06-14 MED ORDER — ONDANSETRON HCL 4 MG/2ML IJ SOLN
INTRAMUSCULAR | Status: DC | PRN
  Administered 2016-06-14: 4 mg via INTRAVENOUS

## 2016-06-14 MED ORDER — FENTANYL CITRATE (PF) 100 MCG/2ML IJ SOLN
INTRAMUSCULAR | Status: DC | PRN
  Administered 2016-06-14 (×2): 50 ug via INTRAVENOUS

## 2016-06-14 MED ORDER — PROPOFOL 10 MG/ML IV BOLUS
INTRAVENOUS | Status: DC | PRN
  Administered 2016-06-14: 20 mg via INTRAVENOUS

## 2016-06-14 MED ORDER — LIDOCAINE-EPINEPHRINE 1 %-1:100000 IJ SOLN
INTRAMUSCULAR | Status: DC | PRN
  Administered 2016-06-14: 20 mL

## 2016-06-14 MED ORDER — LACTATED RINGERS IV SOLN
INTRAVENOUS | Status: DC
  Administered 2016-06-14: 11:00:00 via INTRAVENOUS

## 2016-06-14 MED ORDER — FAMOTIDINE 20 MG PO TABS
20.0000 mg | ORAL_TABLET | Freq: Once | ORAL | Status: AC
Start: 2016-06-14 — End: 2016-06-14
  Administered 2016-06-14: 20 mg via ORAL

## 2016-06-14 MED ORDER — FERRIC SUBSULFATE 259 MG/GM EX SOLN
CUTANEOUS | Status: AC
  Filled 2016-06-14: qty 8

## 2016-06-14 MED ORDER — LIDOCAINE-EPINEPHRINE 1 %-1:100000 IJ SOLN
INTRAMUSCULAR | Status: AC
Start: 2016-06-14 — End: 2016-06-14
  Filled 2016-06-14: qty 1

## 2016-06-14 MED ORDER — LIDOCAINE HCL (CARDIAC) 20 MG/ML IV SOLN
INTRAVENOUS | Status: DC | PRN
  Administered 2016-06-14: 30 mg via INTRAVENOUS

## 2016-06-14 MED ORDER — FERRIC SUBSULFATE 259 MG/GM EX SOLN
CUTANEOUS | Status: DC | PRN
  Administered 2016-06-14: 1

## 2016-06-14 MED ORDER — LACTATED RINGERS IV SOLN: INTRAVENOUS | Status: DC

## 2016-06-14 SURGICAL SUPPLY — 35 items
APPLICATOR COTTON TIP 6IN STRL (MISCELLANEOUS) ×2 IMPLANT
CANISTER SUCT 1200ML W/VALVE (MISCELLANEOUS) ×2 IMPLANT
CATH ROBINSON RED A/P 16FR (CATHETERS) ×2 IMPLANT
DEPRESSOR TONGUE BLADE STERILE (MISCELLANEOUS) ×2 IMPLANT
DRAPE UNDER BUTTOCK W/FLU (DRAPES) ×2 IMPLANT
DRSG TELFA 3X8 NADH (GAUZE/BANDAGES/DRESSINGS) ×2 IMPLANT
ELECT LEEP BALL 5MM 12CM (MISCELLANEOUS) ×2
ELECT LEEP LOOP 20X10 R2010 (MISCELLANEOUS) ×2
ELECT LOOP 1.0X1.0CM R1010 (MISCELLANEOUS) ×2
ELECT REM PT RETURN 9FT ADLT (ELECTROSURGICAL) ×2
ELECTRODE LEEP BALL 5MM 12CM (MISCELLANEOUS) ×1 IMPLANT
ELECTRODE LEP LOOP 20X10 R2010 (MISCELLANEOUS) ×1 IMPLANT
ELECTRODE LOOP 1.0X1.0CM R1010 (MISCELLANEOUS) ×1 IMPLANT
ELECTRODE REM PT RTRN 9FT ADLT (ELECTROSURGICAL) ×1 IMPLANT
GLOVE BIO SURGEON STRL SZ8 (GLOVE) ×2 IMPLANT
GOWN STRL REUS W/ TWL LRG LVL3 (GOWN DISPOSABLE) ×2 IMPLANT
GOWN STRL REUS W/ TWL XL LVL3 (GOWN DISPOSABLE) ×1 IMPLANT
GOWN STRL REUS W/TWL LRG LVL3 (GOWN DISPOSABLE) ×4
GOWN STRL REUS W/TWL XL LVL3 (GOWN DISPOSABLE) ×2
HANDLE YANKAUER SUCT BULB TIP (MISCELLANEOUS) ×2 IMPLANT
KIT RM TURNOVER CYSTO AR (KITS) ×2 IMPLANT
LABEL OR SOLS (LABEL) ×2 IMPLANT
NDL SPNL 22GX3.5 QUINCKE BK (NEEDLE) ×1 IMPLANT
NEEDLE SPNL 22GX3.5 QUINCKE BK (NEEDLE) ×2 IMPLANT
PACK DNC HYST (MISCELLANEOUS) ×2 IMPLANT
PAD DRESSING TELFA 3X8 NADH (GAUZE/BANDAGES/DRESSINGS) ×1 IMPLANT
PAD OB MATERNITY 4.3X12.25 (PERSONAL CARE ITEMS) ×2 IMPLANT
PAD PREP 24X41 OB/GYN DISP (PERSONAL CARE ITEMS) ×2 IMPLANT
PENCIL ELECTRO HAND CTR (MISCELLANEOUS) ×2 IMPLANT
SOL PREP PVP 2OZ (MISCELLANEOUS) ×2
SOLUTION PREP PVP 2OZ (MISCELLANEOUS) ×1 IMPLANT
STRAW SMOKE EVAC LEEP 6150 NON (MISCELLANEOUS) ×2 IMPLANT
SUT SILK 2 0 SH (SUTURE) ×2 IMPLANT
SYRINGE 10CC LL (SYRINGE) ×2 IMPLANT
TOWEL OR 17X26 4PK STRL BLUE (TOWEL DISPOSABLE) ×2 IMPLANT

## 2016-06-14 NOTE — Anesthesia Procedure Notes (Signed)
Date/Time: 06/14/2016 11:44 AM Performed by: Johnna Acosta Pre-anesthesia Checklist: Patient identified, Emergency Drugs available, Suction available, Patient being monitored and Timeout performed Patient Re-evaluated:Patient Re-evaluated prior to inductionOxygen Delivery Method: Simple face mask

## 2016-06-14 NOTE — Discharge Instructions (Signed)
AMBULATORY SURGERY  °DISCHARGE INSTRUCTIONS ° ° °1) The drugs that you were given will stay in your system until tomorrow so for the next 24 hours you should not: ° °A) Drive an automobile °B) Make any legal decisions °C) Drink any alcoholic beverage ° ° °2) You may resume regular meals tomorrow.  Today it is better to start with liquids and gradually work up to solid foods. ° °You may eat anything you prefer, but it is better to start with liquids, then soup and crackers, and gradually work up to solid foods. ° ° °3) Please notify your doctor immediately if you have any unusual bleeding, trouble breathing, redness and pain at the surgery site, drainage, fever, or pain not relieved by medication. ° ° ° °4) Additional Instructions: ° ° ° ° ° ° ° °Please contact your physician with any problems or Same Day Surgery at [phone], Monday through Friday 6 am to 4 pm, or [place] [place] [facility] [place] Main number at [phone]. °

## 2016-06-14 NOTE — Anesthesia Postprocedure Evaluation (Signed)
Anesthesia Post Note  Patient: Lauren Barrera  Procedure(s) Performed: Procedure(s) (LRB): LOOP ELECTROSURGICAL EXCISION PROCEDURE (LEEP) (N/A)  Patient location during evaluation: PACU Anesthesia Type: General Level of consciousness: awake Pain management: satisfactory to patient Respiratory status: spontaneous breathing Cardiovascular status: blood pressure returned to baseline Postop Assessment: no headache Anesthetic complications: no    Last Vitals:  Filed Vitals:   06/14/16 1214 06/14/16 1229  BP: 131/69 138/71  Pulse: 83 80  Temp: 37.1 C   Resp: 19 17    Last Pain: There were no vitals filed for this visit.               VAN STAVEREN,Ermalinda Joubert

## 2016-06-14 NOTE — OR Nursing (Signed)
Patient unable to remove wedding rings tape applied.  Patient is wearing immobilizer on right arm/shoulder due to s/p shoulder surgery.

## 2016-06-14 NOTE — H&P (View-Only) (Signed)
Subjective:  PREOPERATIVE HISTORY AND PHYSICAL     Date of surgery: 06/14/2016 Chief complaint: 1. AGUS Pap smear, unexplained   Patient is a 55 y.o. [id number] scheduled for LEEP cone biopsy for evaluation of AGUS Pap smear, unexplained.  Preoperative workup includes: 1. AGUS Pap smear, unexplained 2. HSIL Pap smear, unexplained 3. 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 Endometrial biopsy benign-disordered proliferative phase endometrium without hyperplasia or carcinoma 5. ECC benign-limited fragments of benign endocervical glands mucus and blood  Pelvic ultrasound from 03/02/2016:  FINDINGS: Uterus  Measurements: 6 point at at 3.6 x 5.7 cm. No fibroids or other mass visualized.  Endometrium  Thickness: 2 mm. No focal abnormality visualized.  Right ovary  Measurements: 2.1 x 1.1 x 1.6 cm. Normal appearance/no adnexal mass.  Left ovary  Measurements: 2.2 x 0.9 x 2.1 cm. Normal appearance/no adnexal mass.  Other findings  No abnormal free fluid.  IMPRESSION: No acute abnormality noted.    Menstrual History: OB History    Gravida Para Term Preterm AB TAB SAB Ectopic Multiple Living   4 2 2  0 2 0 2 0 0 2      Menarche age: NA No LMP recorded. Patient is postmenopausal.    Past Medical History  Diagnosis Date  . Sinus congestion   . Anxiety   . Constipation     Past Surgical History  Procedure Laterality Date  . Dilation and curettage of uterus      x2  . Resection of endocrine tumor  2010    benign pancreatic lesion  . Breast excisional biopsy Bilateral 1976  . Shoulder arthroscopy w/ rotator cuff repair Right     OB History  Gravida Para Term Preterm AB SAB TAB Ectopic Multiple Living  4 2 2  0 2 2 0 0 0 2    # Outcome Date GA Lbr Len/2nd Weight Sex Delivery Anes PTL Lv  4 Term 77    M Vag-Spont   Y  3 Term 1988    F Vag-Spont   Y  2 SAB           1 SAB               Social History   Social History  . Marital  Status: Married    Spouse Name: N/A  . Number of Children: N/A  . Years of Education: N/A   Social History Main Topics  . Smoking status: Never Smoker   . Smokeless tobacco: Never Used  . Alcohol Use: 0.0 oz/week    0 Standard drinks or equivalent per week     Comment: occasional  . Drug Use: No  . Sexual Activity: Yes    Birth Control/ Protection: Post-menopausal   Other Topics Concern  . None   Social History Narrative    Family History  Problem Relation Age of Onset  . Diabetes Mother   . Heart disease Father   . Diabetes Father   . Sudden death Brother     age 57  . Breast cancer Maternal Aunt 59  . Breast cancer Paternal Aunt 1  . Ovarian cancer Neg Hx   . Colon cancer Neg Hx      (Not in a hospital admission)  Allergies  Allergen Reactions  . Codeine Other (See Comments)    Other Reaction: causes prickly sensation    Review of Systems Constitutional: No recent fever/chills/sweats Respiratory: No recent cough/bronchitis Cardiovascular: No chest pain Gastrointestinal: No recent nausea/vomiting/diarrhea Genitourinary: No UTI symptoms Hematologic/lymphatic:No  history of coagulopathy or recent blood thinner use    Objective:    BP 117/75 mmHg  Pulse 84  Ht 5\' 5"  (1.651 m)  Wt 170 lb 6.4 oz (77.293 kg)  BMI 28.36 kg/m2  General:   Normal  Skin:   normal  HEENT:  Normal  Neck:  Supple without Adenopathy or Thyromegaly  Lungs:   Heart:              Breasts:   Abdomen:  Pelvis:  M/S   Extremeties:  Neuro:    clear to auscultation bilaterally   Normal without murmur   Not Examined   soft, non-tender; bowel sounds normal; no masses,  no organomegaly   Exam deferred to OR  No CVAT  Warm/Dry   Normal          Assessment:    AGUS Pap smear, unexplained Benign endometrial biopsy; benign ECC; colposcopic directed biopsies consistent with CIN-1 Ultrasound normal   Plan:   LEEP cone biopsy of the cervix is scheduled for  06/14/2016.  Preoperative counseling: Patient is to undergo repeat cold biopsy for unexplained abnormal Pap smear-AGUS. She is understanding of the planned procedure and is aware of and is accepting of all surgical risks which include but are not limited to bleeding, infection, pelvic organ injury with need for repair, blood clot disorders, anesthesia risks, etc. All questions have been answered. Informed consent is given. Patient is ready and willing to proceed with surgery as scheduled.  Brayton Mars, MD  Note: This dictation was prepared with Dragon dictation along with smaller phrase technology. Any transcriptional errors that result from this process are unintentional.

## 2016-06-14 NOTE — Interval H&P Note (Signed)
History and Physical Interval Note:  06/14/2016 11:01 AM  Lauren Barrera  has presented today for surgery, with the diagnosis of AGUS, DYSPLASIA OF CERVIX LOW GRADE, HSIL ON PAP SMEAR OF CERVIX  The various methods of treatment have been discussed with the patient and family. After consideration of risks, benefits and other options for treatment, the patient has consented to  Procedure(s): LOOP ELECTROSURGICAL EXCISION PROCEDURE (LEEP) (N/A) as a surgical intervention .  The patient's history has been reviewed, patient examined, no change in status, stable for surgery.  I have reviewed the patient's chart and labs.  Questions were answered to the patient's satisfaction.     [name]

## 2016-06-14 NOTE — Transfer of Care (Signed)
Immediate Anesthesia Transfer of Care Note  Patient: Lauren Barrera  Procedure(s) Performed: Procedure(s): LOOP ELECTROSURGICAL EXCISION PROCEDURE (LEEP) (N/A)  Patient Location: PACU  Anesthesia Type:General  Level of Consciousness: awake  Airway & Oxygen Therapy: Patient Spontanous Breathing and Patient connected to face mask oxygen  Post-op Assessment: Report given to RN and Post -op Vital signs reviewed and stable  Post vital signs: Reviewed and stable  Last Vitals:  Filed Vitals:   06/14/16 1013 06/14/16 1214  BP: 136/80 131/69  Pulse: 77 83  Temp: 36.6 C 37.1 C  Resp: 16 19    Last Pain: There were no vitals filed for this visit.       Complications: No apparent anesthesia complications

## 2016-06-14 NOTE — Anesthesia Preprocedure Evaluation (Addendum)
Anesthesia Evaluation  Patient identified by MRN, date of birth, ID band Patient awake    Reviewed: Allergy & Precautions, NPO status , Patient's Chart, lab work & pertinent test results  Airway Mallampati: II       Dental  (+) Teeth Intact   Pulmonary sleep apnea ,    breath sounds clear to auscultation       Cardiovascular Exercise Tolerance: Good  Rhythm:Regular     Neuro/Psych Anxiety negative neurological ROS     GI/Hepatic negative GI ROS, Neg liver ROS,   Endo/Other  negative endocrine ROS  Renal/GU negative Renal ROS     Musculoskeletal   Abdominal Normal abdominal exam  (+)   Peds  Hematology  (+) anemia ,   Anesthesia Other Findings   Reproductive/Obstetrics                             Anesthesia Physical Anesthesia Plan  ASA: II  Anesthesia Plan: General   Post-op Pain Management:    Induction: Intravenous  Airway Management Planned: Natural Airway and Nasal Cannula  Additional Equipment:   Intra-op Plan:   Post-operative Plan:   Informed Consent: I have reviewed the patients History and Physical, chart, labs and discussed the procedure including the risks, benefits and alternatives for the proposed anesthesia with the patient or authorized representative who has indicated his/her understanding and acceptance.     Plan Discussed with: CRNA  Anesthesia Plan Comments:         Anesthesia Quick Evaluation

## 2016-06-14 NOTE — Op Note (Signed)
OPERATIVE NOTE:  MANERVA KLOSE PROCEDURE DATE: 06/14/2016   PREOPERATIVE DIAGNOSIS:  1. AGUS Pap smear, unexplained  POSTOPERATIVE DIAGNOSIS:  1. AGUS Pap smear, unexplained  PROCEDURE:  LEEP cone biopsy   SURGEON:  Brayton Mars, MD ASSISTANTS: none ANESTHESIA: MAC/IV Sedation, Paracervical INDICATIONS: 55 y.o. [id number] with history of postmenopausal bleeding and agonists Pap smear, unexplained. Pelvic ultrasound normal; endometrial stripe 2 mm Endometrial biopsy benign Colposcopy directed biopsies-CIN-1; negative ECC; Pap HGSIL  FINDINGS:  Normal Lugol's staining of the vagina and cervix   I/O's: Total I/O In: 100 [I.V.:100] Out: 110 [Urine:100; Blood:10] COUNTS:  YES SPECIMENS:  1. Ectocervical LEEP 2. Endocervical LEEP 3. Post LEEP ECC  ANTIBIOTIC PROPHYLAXIS:N/A COMPLICATIONS: None immediate  PROCEDURE IN DETAIL: Patient was brought to the operating room and placed in supine position. Gen. anesthesia with mask and IV sedation was induced without difficulty. Patient was placed in the dorsal lithotomy position and external prep and drape was performed with Betadine. Timeout was completed. Coated speculum was placed into the vagina. The vagina and cervix were painted with Lugol's solution with evidence of normal staining. The cervix was infiltrated with 15 cc of 1% lidocaine with 1-200,000 epinephrine. The ectocervical LEEP was performed with a wide loop. Endocervical LEEP was performed with the narrower loop. Post LEEP ECC was performed with a serrated curette. The cone bed was cauterized with rollerball cautery. Astringent was applied. Good hemostasis was noted. Following completion of procedure patient was awakened mobilized and taken to her room in satisfactory condition.  Cherelle Midkiff A. Zipporah Plants, MD, ACO[facility]

## 2016-06-15 ENCOUNTER — Encounter: Payer: Self-pay | Admitting: Obstetrics and Gynecology

## 2016-06-15 ENCOUNTER — Ambulatory Visit: Payer: BC Managed Care – PPO

## 2016-06-15 DIAGNOSIS — M25611 Stiffness of right shoulder, not elsewhere classified: Secondary | ICD-10-CM

## 2016-06-15 DIAGNOSIS — M25511 Pain in right shoulder: Secondary | ICD-10-CM

## 2016-06-15 NOTE — Patient Instructions (Signed)
Flexion (Isometric)    Press right fist against wall. Hold _10___ seconds. Repeat _10___ times. Do _2___ sessions per day.   Extension (Isometric)    Place right bent elbow and back of arm against wall. Press elbow against wall. Hold _10___ seconds. Repeat _10___ times. Do _2___ sessions per day.   Internal Rotation (Isometric)    Place palm of right fist against door frame, with elbow bent. Press fist against door frame. Hold _10___ seconds. Repeat __10__ times. Do __2__ sessions per day.   External Rotation (Isometric)    Place back of left fist against door frame, with elbow bent. Press fist against door frame. Hold __10__ seconds. Repeat _10___ times. Do __2__ sessions per day.  Abduction (Eccentric) - Active-Assist (Pulley)    With hands lightly holding pulley, raise R arm in front of you by using the L arm to pull down. Avoid hiking shoulder. Then slowly lower affected arm for 3-5 seconds. _10__ reps per set, _2__ sets per day,

## 2016-06-15 NOTE — Therapy (Signed)
[facility] [address] [place], [place], [address] Phone: [phone]   Fax:  [phone]  Physical Therapy Treatment  Patient Details  Name: Lauren Barrera MRN: [id number] Date of Birth: [birth year] Referring Provider: Roselee Culver  Encounter Date: 06/15/2016      PT End of Session - 06/15/16 1117    Visit Number 2   Number of Visits 17   Date for PT Re-Evaluation 08/05/16   Authorization Type no g codes   PT Start Time 1100   PT Stop Time 1145   PT Time Calculation (min) 45 min   Activity Tolerance Patient tolerated treatment well   Behavior During Therapy [facility] for tasks assessed/performed      Past Medical History  Diagnosis Date  . Sinus congestion   . Anxiety   . Constipation     Past Surgical History  Procedure Laterality Date  . Dilation and curettage of uterus      x2  . Resection of endocrine tumor  2010    benign pancreatic lesion  . Breast excisional biopsy Bilateral 1976  . Shoulder arthroscopy w/ rotator cuff repair Right   . Leep N/A 06/14/2016    Procedure: LOOP ELECTROSURGICAL EXCISION PROCEDURE (LEEP);  Surgeon: Brayton Mars, MD;  Location: [facility] ORS;  Service: Gynecology;  Laterality: N/A;    There were no vitals filed for this visit.      Subjective Assessment - 06/15/16 1114    Subjective Pt reports she is doing well on this date. She denies any R shoulder pain upon arrival. Pt states that she had her gynecological surgery yesterday without complications. She wore her sling and has not experienced increased soreness since the surgery. Pt reports she is performing HEP without issue and has been compliant with her sling.    Pertinent History Pt underwent s/p R RTC repair, subacromial decompression and biceps tendonitis (no indication of tenodesis) on 05/17/16. No information regarding how many tendons were repaired however MRI from before surgery reports full thickness anterior supraspinatus  tear as well as partial articular surface anterior infraspinatus tear. She reports compliance with use of sling since surgery. She has not been icing. Has been able to disocontinue use of prescription pain medications. Pt reports compliance with exercises since surgery that she was provided within limitations of protocol    Diagnostic tests Before surgery: MRI reports full thickness anterior supraspinatus tear as well as partial articular surface anterior infraspinatus tear, mild subscapularis tendinosis, superior labral fraying with mild tra-articular long head bicept tendinosis. Small joint effusion, synovitis, and mild subacromial/subdeltoid bursitis   Patient Stated Goals Return to driving, work, and yoga   Currently in Pain? No/denies       TREATMENT  Manual Therapy PROM flexion, extension, internal rotation, and external rotation within pain-free and resistance-free motion with 3-5 second hold at end range; Grade I distraction oscillation for pain control; STM to anterior, middle, and posterior deltoid as well as scar massage, pt with gradually decreasing pain as STM increases;  Ther-ex AAROM canes for flexion x 10, pain-free; AAROM canes for external rotation x 10, pain-free; AAROM pulleys for flexion x 10, pain-free; Submaximal isometrics for flexion, extension, IR, and ER 10 seconds hold x 5; Seated scapular retractions 5 seconds hold 2 x 10; Standing pendulums for flexion/extension and horizontal abduction/adduction with 1# weight in hand (no lifting) x 10 each;  PT Education - 06/15/16 1116    Education provided Yes   Education Details HEP progressed   Person(s) Educated Patient   Methods Explanation   Comprehension Verbalized understanding             PT Long Term Goals - 06/10/16 1627    PT LONG TERM GOAL #1   Title Pt will be independent with HEP in order to improve strength and ROM as well as decrease pain   Time 8    Period Weeks   Status New   PT LONG TERM GOAL #2   Title Pt will decrease DASH by at least 8 points demonstrating clinically significant reduction in shoulder disability    Baseline 06/10/16: 100/100   Time 8   Period Weeks   Status New   PT LONG TERM GOAL #3   Title Pt will report worst pain on NPRS as 1/10 in order to demonstrate significant reduction in R shoulder pain    Baseline 06/10/16: worst: 4/10   Time 8   Period Weeks   Status New   PT LONG TERM GOAL #4   Title Pt will increase R shoulder AROM so that it is symmetrical in all planes to LUE in order to perform ADLs such as washing/combing hair   Baseline 06/10/16: unable to test due to protocol restrictions   Time 8   Period Weeks   Status New   PT LONG TERM GOAL #5   Title Pt will increase R shoulder flexion, abduction, extension, IR, and ER strength to at least 4-/5 in order to improve function at home and work   Baseline 06/10/16: unable to test due to protocol restrictions   Time 12   Period Weeks   Status New               Plan - 06/15/16 1117    Clinical Impression Statement Pt demonstrates good progress during today's session. She is able to initiate AAROM canes and pulleys with progressive increase in pain-free motion. Pt issued submaximal isometrics to add to HEP as well as pulleys for flexion. Encouraged to initiate pendulums for motion and continue with scapular retractions. Continue PROM R elbow extension as well as forearm, wrist, and hand strengthening. Pt encouraged to follow-up as scheduled.    Rehab Potential Excellent   Clinical Impairments Affecting Rehab Potential Positive: motivation, age, non-smoker; Negative: extent of repair   PT Frequency 2x / week   PT Duration 8 weeks   PT Treatment/Interventions ADLs/Self Care Home Management;Aquatic Therapy;Electrical Stimulation;Cryotherapy;Iontophoresis 4mg /ml Dexamethasone;Moist Heat;Traction;Ultrasound;Gait training;Therapeutic activities;Therapeutic  exercise;Neuromuscular re-education;Patient/family education;Manual techniques;Dry needling;Passive range of motion;Taping   PT Next Visit Plan Progress PROM, Progress AAROM (per protocol week 4 wand/pulley), continue pain-free isometrics flexion/extension/ER/IR   PT Home Exercise Plan Seated scapular retractions, PROM R elbow extension stretch, pendulums for flexion and horizontal abduction/adduction, AAROM pulleys for flexion, submaximal isometrics for flexion, extension, IR, and ER per protocol   Consulted and Agree with Plan of Care Patient      Patient will benefit from skilled therapeutic intervention in order to improve the following deficits and impairments:  Decreased range of motion, Decreased scar mobility, Decreased strength, Increased muscle spasms, Pain  Visit Diagnosis: Stiffness of right shoulder, not elsewhere classified  Pain in right shoulder     Problem List Patient Active Problem List   Diagnosis Date Noted  . Dysplasia of cervix, low grade (CIN 1) 04/29/2016  . 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 Atypical glandular cells  of undetermined significance (AGUS) on cervical Pap smear 03/30/2016  . PMB (postmenopausal bleeding) 03/30/2016  . Constipation due to slow transit 02/23/2016  . Hyperlipidemia 06/23/2015  . 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 Anxiety and depression 05/23/2015  . ADD (attention deficit disorder) 2015/05/23  . Allergic state 23-May-2015  . Family history of sudden death May 23, 2015  . Bloodgood disease 2015-05-23  . Chronic interstitial cystitis May 23, 2015  . Hypochromic microcytic anemia 2015/05/23  . Obstructive sleep apnea of adult 05-23-15  . Apnea, sleep 10/04/2012   Phillips Grout PT, DPT   Huprich,Jason 06/15/2016, 12:25 PM  [facility] [address] [place], [place], [address] Phone: [phone]   Fax:  [phone]  Name: Lauren Barrera MRN: [id number] Date of Birth:  August 06, 1961

## 2016-06-17 ENCOUNTER — Encounter: Payer: Self-pay | Admitting: Physical Therapy

## 2016-06-17 ENCOUNTER — Ambulatory Visit: Payer: BC Managed Care – PPO

## 2016-06-17 DIAGNOSIS — M25511 Pain in right shoulder: Secondary | ICD-10-CM

## 2016-06-17 DIAGNOSIS — M25611 Stiffness of right shoulder, not elsewhere classified: Secondary | ICD-10-CM

## 2016-06-17 DIAGNOSIS — M6281 Muscle weakness (generalized): Secondary | ICD-10-CM

## 2016-06-17 LAB — SURGICAL PATHOLOGY

## 2016-06-17 NOTE — Therapy (Signed)
[facility] [address] [place], [place], [address] Phone: [phone]   Fax:  [phone]  Physical Therapy Treatment  Patient Details  Name: Lauren Barrera MRN: [id number] Date of Birth: 1961/11/14 Referring Provider: Roselee Culver  Encounter Date: 06/17/2016      PT End of Session - 06/17/16 1417    Visit Number 3   Number of Visits 17   Date for PT Re-Evaluation 08/05/16   Authorization Type no g codes   PT Start Time [id number]   PT Stop Time 1505   PT Time Calculation (min) 45 min   Activity Tolerance Patient tolerated treatment well   Behavior During Therapy [facility] for tasks assessed/performed      Past Medical History  Diagnosis Date  . Sinus congestion   . Anxiety   . Constipation     Past Surgical History  Procedure Laterality Date  . Dilation and curettage of uterus      x2  . Resection of endocrine tumor  2010    benign pancreatic lesion  . Breast excisional biopsy Bilateral 1976  . Shoulder arthroscopy w/ rotator cuff repair Right   . Leep N/A 06/14/2016    Procedure: LOOP ELECTROSURGICAL EXCISION PROCEDURE (LEEP);  Surgeon: Brayton Mars, MD;  Location: [facility] ORS;  Service: Gynecology;  Laterality: N/A;    There were no vitals filed for this visit.      Subjective Assessment - 06/17/16 1417    Subjective Pt states she is doing well today. She denies R shoulder pain currently and states that she is progressing her AAROM with the canes. No specific questions or concerns at this time.    Pertinent History Pt underwent s/p R RTC repair, subacromial decompression and biceps tendonitis (no indication of tenodesis) on 05/17/16. No information regarding how many tendons were repaired however MRI from before surgery reports full thickness anterior supraspinatus tear as well as partial articular surface anterior infraspinatus tear. She reports compliance with use of sling since surgery. She has not been icing. Has been  able to disocontinue use of prescription pain medications. Pt reports compliance with exercises since surgery that she was provided within limitations of protocol    Diagnostic tests Before surgery: MRI reports full thickness anterior supraspinatus tear as well as partial articular surface anterior infraspinatus tear, mild subscapularis tendinosis, superior labral fraying with mild tra-articular long head bicept tendinosis. Small joint effusion, synovitis, and mild subacromial/subdeltoid bursitis   Patient Stated Goals Return to driving, work, and yoga   Currently in Pain? No/denies         TREATMENT  Manual Therapy PROM flexion, extension, internal rotation, and external rotation within pain-free and resistance-free motion with 3-5 second hold at end range; Grade I distraction and AP/PA oscillation for pain control; STM to anterior, middle, and posterior deltoid as well as R lats. Scar massage also performed over arthroscopic incisions. Pt with gradually decreasing pain as STM increases;  Ther-ex AAROM canes for flexion x 10, pain-free; AAROM canes for external rotation x 10, pain-free; Submaximal isometrics at 30 scaption for flexion, extension, IR, and ER 10 seconds hold x 5; L sidelying R scapular isometrics for protraction, retraction, elevation, and depression 5 second hold x 5 in each direction; Seated scapular retractions 5 seconds hold x 10;  Cold pack applied to R shoulder at end of session (unbilled)  PT Education - 06/17/16 1417    Education provided Yes   Education Details Reinforced HEP   Person(s) Educated Patient   Methods Explanation   Comprehension Verbalized understanding             PT Long Term Goals - 06/10/16 1627    PT LONG TERM GOAL #1   Title Pt will be independent with HEP in order to improve strength and ROM as well as decrease pain   Time 8   Period Weeks   Status New   PT LONG TERM GOAL #2   Title  Pt will decrease DASH by at least 8 points demonstrating clinically significant reduction in shoulder disability    Baseline 06/10/16: 100/100   Time 8   Period Weeks   Status New   PT LONG TERM GOAL #3   Title Pt will report worst pain on NPRS as 1/10 in order to demonstrate significant reduction in R shoulder pain    Baseline 06/10/16: worst: 4/10   Time 8   Period Weeks   Status New   PT LONG TERM GOAL #4   Title Pt will increase R shoulder AROM so that it is symmetrical in all planes to LUE in order to perform ADLs such as washing/combing hair   Baseline 06/10/16: unable to test due to protocol restrictions   Time 8   Period Weeks   Status New   PT LONG TERM GOAL #5   Title Pt will increase R shoulder flexion, abduction, extension, IR, and ER strength to at least 4-/5 in order to improve function at home and work   Baseline 06/10/16: unable to test due to protocol restrictions   Time 12   Period Weeks   Status New               Plan - 06/17/16 1418    Clinical Impression Statement Pt continues to demonstrate muscle spasms and protective guarding with slow and gentle PROM. She is able to progress farther with AAROM canes. Gradually decreasing tenderness to palpation with STM. Pt encouraged to continue HEP as prescribed. will continue to progress per protocol at next visit.    Rehab Potential Excellent   Clinical Impairments Affecting Rehab Potential Positive: motivation, age, non-smoker; Negative: extent of repair   PT Frequency 2x / week   PT Duration 8 weeks   PT Treatment/Interventions ADLs/Self Care Home Management;Aquatic Therapy;Electrical Stimulation;Cryotherapy;Iontophoresis 4mg /ml Dexamethasone;Moist Heat;Traction;Ultrasound;Gait training;Therapeutic activities;Therapeutic exercise;Neuromuscular re-education;Patient/family education;Manual techniques;Dry needling;Passive range of motion;Taping   PT Next Visit Plan Progress PROM and AAROM per protocol, continue pain-free  isometrics flexion/extension/ER/IR   PT Home Exercise Plan Seated scapular retractions, PROM R elbow extension stretch, pendulums for flexion and horizontal abduction/adduction, AAROM pulleys for flexion, submaximal isometrics for flexion, extension, IR, and ER per protocol   Consulted and Agree with Plan of Care Patient      Patient will benefit from skilled therapeutic intervention in order to improve the following deficits and impairments:  Decreased range of motion, Decreased scar mobility, Decreased strength, Increased muscle spasms, Pain  Visit Diagnosis: Stiffness of right shoulder, not elsewhere classified  Pain in right shoulder  Muscle weakness (generalized)     Problem List Patient Active Problem List   Diagnosis Date Noted  . Dysplasia of cervix, low grade (CIN 1) 04/29/2016  . 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 Atypical glandular cells of undetermined significance (AGUS) on cervical Pap smear 03/30/2016  . PMB (postmenopausal bleeding) 03/30/2016  . Constipation due  to slow transit 02/23/2016  . Hyperlipidemia 06/23/2015  . 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 Anxiety and depression 20-May-2015  . ADD (attention deficit disorder) May 20, 2015  . Allergic state 05/20/2015  . Family history of sudden death 05-20-15  . Bloodgood disease 05/20/15  . Chronic interstitial cystitis 05/20/15  . Hypochromic microcytic anemia 05/20/2015  . Obstructive sleep apnea of adult 2015-05-20  . Apnea, sleep 10/04/2012   Phillips Grout PT, DPT   Aicia Babinski 06/17/2016, 3:41 PM  [facility] [address] [place], [place], [address] Phone: [phone]   Fax:  [phone]  Name: TAKIYAH DRZEWIECKI MRN: [id number] Date of Birth: Nov 26, 1961

## 2016-06-21 ENCOUNTER — Telehealth: Payer: Self-pay | Admitting: Obstetrics and Gynecology

## 2016-06-21 NOTE — Telephone Encounter (Signed)
Called pt she states that she has been experiencing some discharge as well as light bleeding, and is very concerned about her cramping. Pt equates cramping with that of a menstral cycle. States she has done nothing for relief. Advised pt that post LEEP it is not uncommon to have bleeding as long as it is not heavy (i.e. Soaking pad) also normal to have discharge, pt denies odor or fever. Advised on the use of Tylenol or Motrin for cramping. To f/u as scheduled this Wednesday 06/23/16 ofr post visit.

## 2016-06-21 NOTE — Telephone Encounter (Signed)
SHE IS ACHY CRAMPING LIKE HER PERIOD  AND IS BLEEDING.

## 2016-06-21 NOTE — Telephone Encounter (Signed)
THIS PT HAD SURGERY AND AT FIRST DID NOT HAVE SYMPTOMS BUT NOW IS HAVING PAIN, CRAMPING/ ACHING/ BLEEDING

## 2016-06-22 ENCOUNTER — Ambulatory Visit: Payer: BC Managed Care – PPO | Admitting: Physical Therapy

## 2016-06-22 ENCOUNTER — Encounter: Payer: BC Managed Care – PPO | Admitting: Physical Therapy

## 2016-06-22 DIAGNOSIS — M25611 Stiffness of right shoulder, not elsewhere classified: Secondary | ICD-10-CM | POA: Diagnosis not present

## 2016-06-22 DIAGNOSIS — M6281 Muscle weakness (generalized): Secondary | ICD-10-CM

## 2016-06-22 DIAGNOSIS — M25511 Pain in right shoulder: Secondary | ICD-10-CM

## 2016-06-22 NOTE — Therapy (Signed)
[facility] [address]. [place], [place], [address] Phone: [phone]   Fax:  [phone]  Physical Therapy Treatment  Patient Details  Name: Lauren Barrera MRN: [id number] Date of Birth: 12-16-1960 Referring Provider: Roselee Culver  Encounter Date: 06/22/2016      PT End of Session - 06/22/16 1236/03/20    Visit Number 4   Number of Visits 17   Date for PT Re-Evaluation 08/05/16   Authorization Type no g codes   PT Start Time 03-20-54   PT Stop Time 1146   PT Time Calculation (min) 51 min   Activity Tolerance Patient tolerated treatment well   Behavior During Therapy [facility] for tasks assessed/performed      Past Medical History  Diagnosis Date  . Sinus congestion   . Anxiety   . Constipation     Past Surgical History  Procedure Laterality Date  . Dilation and curettage of uterus      x2  . Resection of endocrine tumor  03/20/2009    benign pancreatic lesion  . Breast excisional biopsy Bilateral 1976  . Shoulder arthroscopy w/ rotator cuff repair Right   . Leep N/A 06/14/2016    Procedure: LOOP ELECTROSURGICAL EXCISION PROCEDURE (LEEP);  Surgeon: Brayton Mars, MD;  Location: [facility] ORS;  Service: Gynecology;  Laterality: N/A;    There were no vitals filed for this visit.      Subjective Assessment - 06/22/16 1234    Subjective No new complaints.  Pt. states she is ready to wean out of sling and returns to MD this afternoon.  Pt. hoping to return to driving after MD appt. to promote greater independence.     Pertinent History Pt underwent s/p R RTC repair, subacromial decompression and biceps tendonitis (no indication of tenodesis) on 05/17/16. No information regarding how many tendons were repaired however MRI from before surgery reports full thickness anterior supraspinatus tear as well as partial articular surface anterior infraspinatus tear. She reports compliance with use of sling since surgery. She has not been icing. Has been  able to disocontinue use of prescription pain medications. Pt reports compliance with exercises since surgery that she was provided within limitations of protocol    Limitations House hold activities;Other (comment);Lifting   Diagnostic tests Before surgery: MRI reports full thickness anterior supraspinatus tear as well as partial articular surface anterior infraspinatus tear, mild subscapularis tendinosis, superior labral fraying with mild tra-articular long head bicept tendinosis. Small joint effusion, synovitis, and mild subacromial/subdeltoid bursitis   Patient Stated Goals Return to driving, work, and yoga   Currently in Pain? No/denies      TREATMENT  Manual Therapy Supine AA/PROM flexion, extension, internal rotation, and external rotation within pain-free and resistance-free motion with 3-5 second hold at end range; Grade I distraction and AP/PA oscillation for pain control; STM to anterior, middle, and posterior deltoid as well as R lats. Decrease tenderness/ ecchymosis noted.   Ther-ex Supine AAROM canes for flexion/ press-ups/ ER/ abd x 20, pain-free (see HEP handouts) Submaximal isometrics at 30 scaption for flexion, extension, IR, and ER 10 seconds hold x 5; Seated scapular retractions 5 seconds hold x 10;  Seated cervical AROM all planes/ stretches.    Cold pack applied to R shoulder at end of session Lysle Morales)      PT Education - 06/22/16 1237    Education provided Yes   Education Details Supine B shoulder AAROM ex.    Person(s) Educated Patient   Methods  Explanation;Handout   Comprehension Verbalized understanding             PT Long Term Goals - 06/10/16 1627    PT LONG TERM GOAL #1   Title Pt will be independent with HEP in order to improve strength and ROM as well as decrease pain   Time 8   Period Weeks   Status New   PT LONG TERM GOAL #2   Title Pt will decrease DASH by at least 8 points demonstrating clinically significant reduction in shoulder  disability    Baseline 06/10/16: 100/100   Time 8   Period Weeks   Status New   PT LONG TERM GOAL #3   Title Pt will report worst pain on NPRS as 1/10 in order to demonstrate significant reduction in R shoulder pain    Baseline 06/10/16: worst: 4/10   Time 8   Period Weeks   Status New   PT LONG TERM GOAL #4   Title Pt will increase R shoulder AROM so that it is symmetrical in all planes to LUE in order to perform ADLs such as washing/combing hair   Baseline 06/10/16: unable to test due to protocol restrictions   Time 8   Period Weeks   Status New   PT LONG TERM GOAL #5   Title Pt will increase R shoulder flexion, abduction, extension, IR, and ER strength to at least 4-/5 in order to improve function at home and work   Baseline 06/10/16: unable to test due to protocol restrictions   Time 12   Period Weeks   Status New            Plan - 06/22/16 1241    Clinical Impression Statement Pt. progressing well with R shoulder AA/PROM (all planes) per MD protocol.  Good technique noted with addition of supine wand based ex. program focusing on AAROM at this time.  Pt. demonstrates protective muscle guarding with slow/ gentle AA/PROM.  Minimal to no tenderness with palpation around R sh./deltoid/ prox. biceps.  Pt. compliant with HEP and following MD protocol at this time.     Rehab Potential Excellent   Clinical Impairments Affecting Rehab Potential Positive: motivation, age, non-smoker; Negative: extent of repair   PT Frequency 2x / week   PT Duration 8 weeks   PT Treatment/Interventions ADLs/Self Care Home Management;Aquatic Therapy;Electrical Stimulation;Cryotherapy;Iontophoresis 4mg /ml Dexamethasone;Moist Heat;Traction;Ultrasound;Gait training;Therapeutic activities;Therapeutic exercise;Neuromuscular re-education;Patient/family education;Manual techniques;Dry needling;Passive range of motion;Taping   PT Next Visit Plan Progress PROM and AAROM per protocol, continue pain-free isometrics  flexion/extension/ER/IR.  Discuss MD f/u.     PT Home Exercise Plan Seated scapular retractions, PROM R elbow extension stretch, pendulums for flexion and horizontal abduction/adduction, AAROM pulleys for flexion, submaximal isometrics for flexion, extension, IR, and ER per protocol   Consulted and Agree with Plan of Care Patient      Patient will benefit from skilled therapeutic intervention in order to improve the following deficits and impairments:  Decreased range of motion, Decreased scar mobility, Decreased strength, Increased muscle spasms, Pain  Visit Diagnosis: Stiffness of right shoulder, not elsewhere classified  Pain in right shoulder  Muscle weakness (generalized)     Problem List Patient Active Problem List   Diagnosis Date Noted  . Dysplasia of cervix, low grade (CIN 1) 04/29/2016  . 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 Atypical glandular cells of undetermined significance (AGUS) on cervical Pap smear 03/30/2016  . PMB (postmenopausal bleeding) 03/30/2016  . Constipation due to slow transit  02/23/2016  . Hyperlipidemia 06/23/2015  . 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 Anxiety and depression May 26, 2015  . ADD (attention deficit disorder) 2015-05-26  . Allergic state 05-26-15  . Family history of sudden death May 26, 2015  . Bloodgood disease 05/26/15  . Chronic interstitial cystitis 05-26-2015  . Hypochromic microcytic anemia 05-26-15  . Obstructive sleep apnea of adult May 26, 2015  . Apnea, sleep 10/04/2012   Pura Spice, PT, DPT # [phone]   06/22/2016, 12:46 PM  [facility] [address] [place], [place], [address] Phone: [phone]   Fax:  [phone]  Name: HOKULANI MAHADY MRN: [id number] Date of Birth: 06-01-1961

## 2016-06-23 ENCOUNTER — Encounter: Payer: Self-pay | Admitting: Obstetrics and Gynecology

## 2016-06-23 ENCOUNTER — Ambulatory Visit (INDEPENDENT_AMBULATORY_CARE_PROVIDER_SITE_OTHER): Payer: BC Managed Care – PPO | Admitting: Obstetrics and Gynecology

## 2016-06-23 VITALS — BP 149/89 | HR 75 | Ht 65.0 in | Wt 171.0 lb

## 2016-06-23 DIAGNOSIS — R87619 Unspecified abnormal cytological findings in specimens from cervix uteri: Secondary | ICD-10-CM

## 2016-06-23 DIAGNOSIS — Z9889 Other specified postprocedural states: Secondary | ICD-10-CM

## 2016-06-23 DIAGNOSIS — Z09 Encounter for follow-up examination after completed treatment for conditions other than malignant neoplasm: Secondary | ICD-10-CM

## 2016-06-23 NOTE — Progress Notes (Signed)
Chief complaint: 1. One week postop check 2. Status post LEEP cone biopsy for evaluation of AGUS Pap smear  Patient reports greenish discharge. No significant pelvic pain. No fever. No heavy vaginal bleeding.    Preoperative workup includes: 1. AGUS Pap smear, unexplained 2. HSIL Pap smear, unexplained 3. 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 Endometrial biopsy benign-disordered proliferative phase endometrium without hyperplasia or carcinoma 5. ECC benign-limited fragments of benign endocervical glands mucus and blood  PATHOLOGY: DIAGNOSIS:  A. ECTOCERVIX; LEEP:  - FOCAL LOW GRADE SQUAMOUS INTRAEPITHELIAL LESION (LSIL).  - ADDITIONAL DEEPER LEVELS WERE EXAMINED.    B. ENDOCERVIX; LEEP:  - BENIGN ENDOCERVICAL TISSUE.  - NABOTHIAN CYST.  - ADDITIONAL DEEPER LEVELS WERE EXAMINED.   C. ENDOCERVIX POST LEEP; CURETTAGE:  - BENIGN CERVICAL TISSUE.  - SMALL FRAGMENTS OF ENDOMETRIAL TYPE GLANDS.  - ADDITIONAL DEEPER LEVELS WERE EXAMINED.   Note: The surgical margins appear negative.   OBJECTIVE: BP 149/89 mmHg  Pulse 75  Ht 5\' 5"  (1.651 m)  Wt 171 lb (77.565 kg)  BMI 28.46 kg/m2 Pleasant female in no acute distress Pelvic exam: Speculum exam-cone bed healing with minimal discharge. No odor. No active bleeding.  ASSESSMENT: 1. One week status post LEEP cone biopsy for evaluation of AGUS Pap smear-normal 2. Pathology negative for abnormality except CIN-1 (completely excised)  PLAN: 1. Avoid anything in vagina for 5 more weeks 2. Return in 6 months for Pap smear. We will recommend serial Pap smears every 6 months 2 years 3. If Herbert Pun findings return, will recommend hysterectomy BSO   Brayton Mars, MD  Note: This dictation was prepared with Dragon dictation along with smaller phrase technology. Any transcriptional errors that result from this process are unintentional.

## 2016-06-23 NOTE — Patient Instructions (Signed)
1. Nothing in vagina for 5 weeks 2. Return in 6 months for Pap smear

## 2016-06-24 ENCOUNTER — Ambulatory Visit: Payer: BC Managed Care – PPO | Admitting: Physical Therapy

## 2016-06-24 ENCOUNTER — Encounter: Payer: Self-pay | Admitting: Physical Therapy

## 2016-06-24 DIAGNOSIS — M25611 Stiffness of right shoulder, not elsewhere classified: Secondary | ICD-10-CM

## 2016-06-24 DIAGNOSIS — M25511 Pain in right shoulder: Secondary | ICD-10-CM

## 2016-06-24 DIAGNOSIS — M6281 Muscle weakness (generalized): Secondary | ICD-10-CM

## 2016-06-24 NOTE — Therapy (Signed)
[facility] [address] [place], [place], [address] Phone: [phone]   Fax:  [phone]  Physical Therapy Treatment  Patient Details  Name: Lauren Barrera MRN: [id number] Date of Birth: 1961-03-31 Referring Provider: Roselee Culver  Encounter Date: 06/24/2016      PT End of Session - 06/24/16 1604    Visit Number 5   Number of Visits 17   Date for PT Re-Evaluation 08/05/16   PT Start Time 1430   PT Stop Time 1514   PT Time Calculation (min) 44 min   Activity Tolerance Patient tolerated treatment well   Behavior During Therapy [facility] for tasks assessed/performed      Past Medical History  Diagnosis Date  . Sinus congestion   . Anxiety   . Constipation     Past Surgical History  Procedure Laterality Date  . Dilation and curettage of uterus      x2  . Resection of endocrine tumor  2010    benign pancreatic lesion  . Breast excisional biopsy Bilateral 1976  . Shoulder arthroscopy w/ rotator cuff repair Right   . Leep N/A 06/14/2016    Procedure: LOOP ELECTROSURGICAL EXCISION PROCEDURE (LEEP);  Surgeon: Brayton Mars, MD;  Location: [facility] ORS;  Service: Gynecology;  Laterality: N/A;    There were no vitals filed for this visit.      Subjective Assessment - 06/24/16 1602    Subjective Pt arrives to PT clinic without sling. States that it has felt good to be out of the sling and back to driving. Pt reports that first couple car rides she was concerned about her shoulder, but she feels better about her shoulder pain/ROM after several driving trips.    Pertinent History Pt underwent s/p R RTC repair, subacromial decompression and biceps tendonitis (no indication of tenodesis) on 05/17/16. No information regarding how many tendons were repaired however MRI from before surgery reports full thickness anterior supraspinatus tear as well as partial articular surface anterior infraspinatus tear. She reports compliance with use of  sling since surgery. She has not been icing. Has been able to disocontinue use of prescription pain medications. Pt reports compliance with exercises since surgery that she was provided within limitations of protocol    Limitations House hold activities;Other (comment);Lifting   Diagnostic tests Before surgery: MRI reports full thickness anterior supraspinatus tear as well as partial articular surface anterior infraspinatus tear, mild subscapularis tendinosis, superior labral fraying with mild tra-articular long head bicept tendinosis. Small joint effusion, synovitis, and mild subacromial/subdeltoid bursitis   Patient Stated Goals Return to driving, work, and yoga   Currently in Pain? No/denies     Objective:  Manual tx: Supine PROM flexion, abduction, internal rotation, external rotation and scaption to tolerance. Pt demonstrates mild-moderate muscle guarding with end range ROM, reporting mild increase in shoulder pain at end range.   There ex: Supine AAROM with wand performing flexion, press ups, external rotation and abduction. Pt reporting difficulty performing external rotation and abduction as part of exercise at home; reviewed technique with cueing to limit elbow ext during ER and hand placement for abduction. Wall ladder x10 shoulder abduction, shoulder flexion with range up to 131 deg without compensation. Long lever ball isometrics from 90 deg flexion to end range.   Pt response for medical necessity: Pt demonstrates good functional mobility of R shoulder with AAROM exercises. She has difficulty with PROM secondary to muscle guarding and is able to achieve increased range during [facility]  activities. Progressing well through protocol.        PT Long Term Goals - 06/10/16 1627    PT LONG TERM GOAL #1   Title Pt will be independent with HEP in order to improve strength and ROM as well as decrease pain   Time 8   Period Weeks   Status New   PT LONG TERM GOAL #2   Title Pt will decrease  DASH by at least 8 points demonstrating clinically significant reduction in shoulder disability    Baseline 06/10/16: 100/100   Time 8   Period Weeks   Status New   PT LONG TERM GOAL #3   Title Pt will report worst pain on NPRS as 1/10 in order to demonstrate significant reduction in R shoulder pain    Baseline 06/10/16: worst: 4/10   Time 8   Period Weeks   Status New   PT LONG TERM GOAL #4   Title Pt will increase R shoulder AROM so that it is symmetrical in all planes to LUE in order to perform ADLs such as washing/combing hair   Baseline 06/10/16: unable to test due to protocol restrictions   Time 8   Period Weeks   Status New   PT LONG TERM GOAL #5   Title Pt will increase R shoulder flexion, abduction, extension, IR, and ER strength to at least 4-/5 in order to improve function at home and work   Baseline 06/10/16: unable to test due to protocol restrictions   Time 12   Period Weeks   Status New               Plan - 06/24/16 1604    Clinical Impression Statement Pt demonstrates progress toward functional ROM with shoulder flexion: 131. She is most limited in external rotation range by onset of pain and stiffness in shoulder; able to achieve 44 deg on R shoulder (compared to 70 deg on left). She demonstrates mild to moderate muscle trembling after repeated AAROM and long lever ball stabilization task.   Rehab Potential Excellent   Clinical Impairments Affecting Rehab Potential Positive: motivation, age, non-smoker; Negative: extent of repair   PT Frequency 2x / week   PT Duration 8 weeks   PT Treatment/Interventions ADLs/Self Care Home Management;Aquatic Therapy;Electrical Stimulation;Cryotherapy;Iontophoresis 4mg /ml Dexamethasone;Moist Heat;Traction;Ultrasound;Gait training;Therapeutic activities;Therapeutic exercise;Neuromuscular re-education;Patient/family education;Manual techniques;Dry needling;Passive range of motion;Taping   PT Next Visit Plan Progress PROM and AAROM per  protocol, continue pain-free isometrics flexion/extension/ER/IR.    PT Home Exercise Plan Seated scapular retractions, PROM R elbow extension stretch, pendulums for flexion and horizontal abduction/adduction, AAROM pulleys for flexion, submaximal isometrics for flexion, extension, IR, and ER per protocol   Consulted and Agree with Plan of Care Patient      Patient will benefit from skilled therapeutic intervention in order to improve the following deficits and impairments:  Decreased range of motion, Decreased scar mobility, Decreased strength, Increased muscle spasms, Pain  Visit Diagnosis: Stiffness of right shoulder, not elsewhere classified  Pain in right shoulder  Muscle weakness (generalized)     Problem List Patient Active Problem List   Diagnosis Date Noted  . Status post LEEP (loop electrosurgical excision procedure) of cervix 06/23/2016  . Dysplasia of cervix, low grade (CIN 1) 04/29/2016  . 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 Constipation due to slow transit 02/23/2016  . Hyperlipidemia 06/23/2015  . 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 Anxiety and depression 05/15/2015  . ADD (attention deficit disorder) 05/15/2015  .  Allergic state May 21, 2015  . Family history of sudden death May 21, 2015  . Bloodgood disease 05-21-2015  . Chronic interstitial cystitis 21-May-2015  . Hypochromic microcytic anemia May 21, 2015  . Obstructive sleep apnea of adult 05-21-15   Pura Spice, PT, DPT # [id number] Mickel Baas Maksym Pfiffner SPT 06/24/2016, 4:09 PM  [facility] [address] [place], [place], [address] Phone: [phone]   Fax:  [phone]  Name: Lauren Barrera MRN: [id number] Date of Birth: 08-15-1961

## 2016-06-29 ENCOUNTER — Encounter: Payer: Self-pay | Admitting: Physical Therapy

## 2016-06-29 ENCOUNTER — Ambulatory Visit: Payer: BC Managed Care – PPO | Admitting: Physical Therapy

## 2016-06-29 DIAGNOSIS — M25611 Stiffness of right shoulder, not elsewhere classified: Secondary | ICD-10-CM

## 2016-06-29 DIAGNOSIS — M6281 Muscle weakness (generalized): Secondary | ICD-10-CM

## 2016-06-29 DIAGNOSIS — M25511 Pain in right shoulder: Secondary | ICD-10-CM

## 2016-06-29 NOTE — Therapy (Signed)
[facility] [address] [place], [place], [address] Phone: [phone]   Fax:  [phone]  Physical Therapy Treatment  Patient Details  Name: Lauren Barrera MRN: [id number] Date of Birth: 02-Jul-1961 Referring Provider: Roselee Culver  Encounter Date: 06/29/2016      PT End of Session - 06/29/16 1506    Visit Number 6   Number of Visits 17   Date for PT Re-Evaluation 08/05/16   PT Start Time [id number]   PT Stop Time 1458   PT Time Calculation (min) 50 min   Activity Tolerance Patient tolerated treatment well   Behavior During Therapy [facility] for tasks assessed/performed      Past Medical History:  Diagnosis Date  . Anxiety   . Constipation   . Sinus congestion     Past Surgical History:  Procedure Laterality Date  . BREAST EXCISIONAL BIOPSY Bilateral 1976  . DILATION AND CURETTAGE OF UTERUS     x2  . LEEP N/A 06/14/2016   Procedure: LOOP ELECTROSURGICAL EXCISION PROCEDURE (LEEP);  Surgeon: Brayton Mars, MD;  Location: [facility] ORS;  Service: Gynecology;  Laterality: N/A;  . 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 SHOULDER ARTHROSCOPY W/ ROTATOR CUFF REPAIR Right     There were no vitals filed for this visit.      Subjective Assessment - 06/29/16 1414    Subjective Pt states that she did a water class on Monday with good results, no increase in pain. She will be on vacation the next week and back to work two weeks later.   Pertinent History Pt underwent s/p R RTC repair, subacromial decompression and biceps tendonitis (no indication of tenodesis) on 05/17/16. No information regarding how many tendons were repaired however MRI from before surgery reports full thickness anterior supraspinatus tear as well as partial articular surface anterior infraspinatus tear. She reports compliance with use of sling since surgery. She has not been icing. Has been able to disocontinue use of prescription pain  medications. Pt reports compliance with exercises since surgery that she was provided within limitations of protocol    Limitations House hold activities;Other (comment);Lifting   Diagnostic tests Before surgery: MRI reports full thickness anterior supraspinatus tear as well as partial articular surface anterior infraspinatus tear, mild subscapularis tendinosis, superior labral fraying with mild tra-articular long head bicept tendinosis. Small joint effusion, synovitis, and mild subacromial/subdeltoid bursitis   Patient Stated Goals Return to driving, work, and yoga   Currently in Pain? No/denies      Objective:  There ex: Supine AAROM with wand performing flexion, press ups, external rotation and abduction. Pt reporting continued difficulty performing external rotation and abduction as part of exercise at home; reviewed technique with cueing to limit elbow ext during ER and hand placement for abduction. Wall ladder x10 shoulder abduction, shoulder flexion with pt demonstrating good functional mobility in both planes with acute increase of ant shoulder pain following end range abd. AAROM ball slides x10 to promote flexion tolerance/ROM. Short lever wall slide with towel from approx 90 deg shoulder flexion to tolerance. Pt demonstrating good mechanics with no substitution patterns.  Ice for 10 minutes on anterior/posterior R shoulder in sitting.  Pt response for medical necessity: Pt demonstrates good functional mobility of R shoulder with AAROM exercises. Progressing well through protocol.        PT Education - 06/29/16 1505    Education provided Yes   Education Details review of HEP:  shoulder abduction and shoulder external rotation   Person(s) Educated Patient   Methods Explanation   Comprehension Verbalized understanding;Returned demonstration             PT Long Term Goals - 06/10/16 1627      PT LONG TERM GOAL #1   Title Pt will be independent with HEP in order to improve  strength and ROM as well as decrease pain   Time 8   Period Weeks   Status New     PT LONG TERM GOAL #2   Title Pt will decrease DASH by at least 8 points demonstrating clinically significant reduction in shoulder disability    Baseline 06/10/16: 100/100   Time 8   Period Weeks   Status New     PT LONG TERM GOAL #3   Title Pt will report worst pain on NPRS as 1/10 in order to demonstrate significant reduction in R shoulder pain    Baseline 06/10/16: worst: 4/10   Time 8   Period Weeks   Status New     PT LONG TERM GOAL #4   Title Pt will increase R shoulder AROM so that it is symmetrical in all planes to LUE in order to perform ADLs such as washing/combing hair   Baseline 06/10/16: unable to test due to protocol restrictions   Time 8   Period Weeks   Status New     PT LONG TERM GOAL #5   Title Pt will increase R shoulder flexion, abduction, extension, IR, and ER strength to at least 4-/5 in order to improve function at home and work   Baseline 06/10/16: unable to test due to protocol restrictions   Time 12   Period Weeks   Status New               Plan - 06/29/16 1507    Clinical Impression Statement Pt demonstrates good progress with ROM/AAROM. She has difficulty performing shoulder abd/er AAROM wand exercises consistently and benefits from supervision of exercise to isolate movement planes. Pt with report of 5/10 acute pain with end range abduction.    Rehab Potential Excellent   Clinical Impairments Affecting Rehab Potential Positive: motivation, age, non-smoker; Negative: extent of repair   PT Frequency 2x / week   PT Duration 8 weeks   PT Treatment/Interventions ADLs/Self Care Home Management;Aquatic Therapy;Electrical Stimulation;Cryotherapy;Iontophoresis 4mg /ml Dexamethasone;Moist Heat;Traction;Ultrasound;Gait training;Therapeutic activities;Therapeutic exercise;Neuromuscular re-education;Patient/family education;Manual techniques;Dry needling;Passive range of  motion;Taping   PT Next Visit Plan Progress PROM and AAROM per protocol, continue pain-free isometrics flexion/extension/ER/IR.    PT Home Exercise Plan Seated scapular retractions, PROM R elbow extension stretch, pendulums for flexion and horizontal abduction/adduction, AAROM pulleys for flexion, submaximal isometrics for flexion, extension, IR, and ER per protocol   Consulted and Agree with Plan of Care Patient      Patient will benefit from skilled therapeutic intervention in order to improve the following deficits and impairments:  Decreased range of motion, Decreased scar mobility, Decreased strength, Increased muscle spasms, Pain  Visit Diagnosis: Stiffness of right shoulder, not elsewhere classified  Pain in right shoulder  Muscle weakness (generalized)     Problem List Patient Active Problem List   Diagnosis Date Noted  . Status post LEEP (loop electrosurgical excision procedure) of cervix 06/23/2016  . Dysplasia of cervix, low grade (CIN 1) 04/29/2016  . 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 Constipation due to slow transit 02/23/2016  . Hyperlipidemia 06/23/2015  . 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  Anxiety and depression 27-May-2015  . ADD (attention deficit disorder) 05-27-2015  . Allergic state 05-27-15  . Family history of sudden death 05-27-2015  . Bloodgood disease 2015/05/27  . Chronic interstitial cystitis May 27, 2015  . Hypochromic microcytic anemia 05/27/15  . Obstructive sleep apnea of adult 05/27/15   Pura Spice, PT, DPT # [id number] Mickel Baas Bralee Feldt SPT 06/29/2016, 5:11 PM  [facility] [address] [place], [place], [address] Phone: [phone]   Fax:  [phone]  Name: Lauren Barrera MRN: [id number] Date of Birth: 03-Feb-1961

## 2016-07-01 ENCOUNTER — Ambulatory Visit: Payer: BC Managed Care – PPO | Admitting: Physical Therapy

## 2016-07-01 ENCOUNTER — Encounter: Payer: Self-pay | Admitting: Physical Therapy

## 2016-07-01 ENCOUNTER — Other Ambulatory Visit: Payer: Self-pay | Admitting: Internal Medicine

## 2016-07-01 DIAGNOSIS — M25511 Pain in right shoulder: Secondary | ICD-10-CM

## 2016-07-01 DIAGNOSIS — M25611 Stiffness of right shoulder, not elsewhere classified: Secondary | ICD-10-CM

## 2016-07-01 DIAGNOSIS — Z1231 Encounter for screening mammogram for malignant neoplasm of breast: Secondary | ICD-10-CM

## 2016-07-01 DIAGNOSIS — M6281 Muscle weakness (generalized): Secondary | ICD-10-CM

## 2016-07-01 NOTE — Therapy (Signed)
[facility] [facility] [address] [place], [place], [address] Phone: [phone]   Fax:  [phone]  Physical Therapy Treatment  Patient Details  Name: Lauren Barrera MRN: [id number] Date of Birth: Apr 18, 1961 Referring Provider: Roselee Culver  Encounter Date: 07/01/2016      PT End of Session - 07/01/16 1620    Visit Number 7   Number of Visits 17   Date for PT Re-Evaluation 08/05/16   PT Start Time 1430   PT Stop Time 1514   PT Time Calculation (min) 44 min   Activity Tolerance Patient tolerated treatment well   Behavior During Therapy [facility] for tasks assessed/performed      Past Medical History:  Diagnosis Date  . Anxiety   . Constipation   . Sinus congestion     Past Surgical History:  Procedure Laterality Date  . BREAST EXCISIONAL BIOPSY Bilateral 1976  . DILATION AND CURETTAGE OF UTERUS     x2  . LEEP N/A 06/14/2016   Procedure: LOOP ELECTROSURGICAL EXCISION PROCEDURE (LEEP);  Surgeon: Brayton Mars, MD;  Location: [facility] ORS;  Service: Gynecology;  Laterality: N/A;  . 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 SHOULDER ARTHROSCOPY W/ ROTATOR CUFF REPAIR Right     There were no vitals filed for this visit.      Subjective Assessment - 07/01/16 1508    Subjective Pt states she will be on a 1 week beach vacation the next week. Reports compliance with HEP and no increase in shoulder pain sx.   Pertinent History Pt underwent s/p R RTC repair, subacromial decompression and biceps tendonitis (no indication of tenodesis) on 05/17/16. No information regarding how many tendons were repaired however MRI from before surgery reports full thickness anterior supraspinatus tear as well as partial articular surface anterior infraspinatus tear. She reports compliance with use of sling since surgery. She has not been icing. Has been able to disocontinue use of prescription pain medications. Pt reports compliance with  exercises since surgery that she was provided within limitations of protocol    Limitations House hold activities;Other (comment);Lifting   Diagnostic tests Before surgery: MRI reports full thickness anterior supraspinatus tear as well as partial articular surface anterior infraspinatus tear, mild subscapularis tendinosis, superior labral fraying with mild tra-articular long head bicept tendinosis. Small joint effusion, synovitis, and mild subacromial/subdeltoid bursitis   Patient Stated Goals Return to driving, work, and yoga   Currently in Pain? No/denies      Objective:  There ex: UBE 3 minutes frwd/back (warm up). Standing AAROM for flex/scaption/abd/er; pt requires cueing for set up/proper technique with abd/er. Wall ladder flex/abd x8 ea. AAROM ball roll on wall in flx x10. Shoulder isometrics with 90 deg elbow flexion and neutral shoulder for flex/er/ir/abd/ext. Pt with early onset of muscle fatigue/trembling during shoulder isometrics. Pt initially with mild c/o of pain in shoulder; instructed to reduce max contraction to less than <25%; pt able to tolerate exercise with no increase in shoulder sx. Review of past/current HEP.  Pt response for medical necessity: Pt demonstrates good functional mobility and progress per M.D. Protocol. She is currently progressing through [facility] and isometric strengthening exercises with minimal reports of shoulder pain and no tendency for compensatory patterns.        PT Long Term Goals - 06/10/16 1627      PT LONG TERM GOAL #1   Title Pt will be independent with HEP in order to improve strength  and ROM as well as decrease pain   Time 8   Period Weeks   Status New     PT LONG TERM GOAL #2   Title Pt will decrease DASH by at least 8 points demonstrating clinically significant reduction in shoulder disability    Baseline 06/10/16: 100/100   Time 8   Period Weeks   Status New     PT LONG TERM GOAL #3   Title Pt will report worst pain on NPRS as 1/10  in order to demonstrate significant reduction in R shoulder pain    Baseline 06/10/16: worst: 4/10   Time 8   Period Weeks   Status New     PT LONG TERM GOAL #4   Title Pt will increase R shoulder AROM so that it is symmetrical in all planes to LUE in order to perform ADLs such as washing/combing hair   Baseline 06/10/16: unable to test due to protocol restrictions   Time 8   Period Weeks   Status New     PT LONG TERM GOAL #5   Title Pt will increase R shoulder flexion, abduction, extension, IR, and ER strength to at least 4-/5 in order to improve function at home and work   Baseline 06/10/16: unable to test due to protocol restrictions   Time 12   Period Weeks   Status New               Plan - 07/01/16 1621    Clinical Impression Statement Pt continues to show progress with ROM/AAROM and overall mobility of R shoulder. She demonstrates early onset of muscle fatigue with shoulder isometric exercises but no tendency for compensatory patterns. Overall progressing well through protocol.   Rehab Potential Excellent   Clinical Impairments Affecting Rehab Potential Positive: motivation, age, non-smoker; Negative: extent of repair   PT Frequency 2x / week   PT Duration 8 weeks   PT Treatment/Interventions ADLs/Self Care Home Management;Aquatic Therapy;Electrical Stimulation;Cryotherapy;Iontophoresis 4mg /ml Dexamethasone;Moist Heat;Traction;Ultrasound;Gait training;Therapeutic activities;Therapeutic exercise;Neuromuscular re-education;Patient/family education;Manual techniques;Dry needling;Passive range of motion;Taping   PT Next Visit Plan Progress PROM and AAROM per protocol, continue pain-free isometrics flexion/extension/ER/IR.    PT Home Exercise Plan Seated scapular retractions, PROM R elbow extension stretch, pendulums for flexion and horizontal abduction/adduction, AAROM pulleys for flexion, submaximal isometrics for flexion, extension, IR, and ER per protocol   Consulted and Agree  with Plan of Care Patient      Patient will benefit from skilled therapeutic intervention in order to improve the following deficits and impairments:  Decreased range of motion, Decreased scar mobility, Decreased strength, Increased muscle spasms, Pain  Visit Diagnosis: Stiffness of right shoulder, not elsewhere classified  Pain in right shoulder  Muscle weakness (generalized)     Problem List Patient Active Problem List   Diagnosis Date Noted  . Status post LEEP (loop electrosurgical excision procedure) of cervix 06/23/2016  . Dysplasia of cervix, low grade (CIN 1) 04/29/2016  . 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 Constipation due to slow transit 02/23/2016  . Hyperlipidemia 06/23/2015  . 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 Anxiety and depression 05/18/2015  . ADD (attention deficit disorder) May 18, 2015  . Allergic state 05-18-15  . Family history of sudden death 2015-05-18  . Bloodgood disease 2015/05/18  . Chronic interstitial cystitis 05-18-2015  . Hypochromic microcytic anemia 18-May-2015  . Obstructive sleep apnea of adult 05-18-2015   Pura Spice, PT, DPT # [id number] Derrill Memo, SPT 07/02/2016, 3:16 PM  [place]  [facility] [address] [place], [place], [address] Phone: [phone]   Fax:  [phone]  Name: Lauren Barrera MRN: [id number] Date of Birth: 1961/08/29

## 2016-07-06 ENCOUNTER — Encounter: Payer: BC Managed Care – PPO | Admitting: Physical Therapy

## 2016-07-08 ENCOUNTER — Encounter: Payer: BC Managed Care – PPO | Admitting: Physical Therapy

## 2016-07-13 ENCOUNTER — Encounter: Payer: Self-pay | Admitting: Physical Therapy

## 2016-07-13 ENCOUNTER — Ambulatory Visit: Payer: BC Managed Care – PPO | Attending: Orthopedic Surgery | Admitting: Physical Therapy

## 2016-07-13 DIAGNOSIS — M6281 Muscle weakness (generalized): Secondary | ICD-10-CM | POA: Insufficient documentation

## 2016-07-13 DIAGNOSIS — M25611 Stiffness of right shoulder, not elsewhere classified: Secondary | ICD-10-CM | POA: Insufficient documentation

## 2016-07-13 DIAGNOSIS — M25511 Pain in right shoulder: Secondary | ICD-10-CM | POA: Insufficient documentation

## 2016-07-13 NOTE — Therapy (Signed)
[facility] [address]. [place], [place], [address] Phone: [phone]   Fax:  [phone]  Physical Therapy Treatment  Patient Details  Name: Lauren Barrera MRN: [id number] Date of Birth: 02-Apr-1961 Referring Provider: Roselee Culver  Encounter Date: 07/13/2016      PT End of Session - 07/13/16 1721    Visit Number 8   Number of Visits 17   Date for PT Re-Evaluation 08/05/16   PT Start Time 1426   PT Stop Time 1511   PT Time Calculation (min) 45 min   Activity Tolerance Patient tolerated treatment well   Behavior During Therapy [facility] for tasks assessed/performed      Past Medical History:  Diagnosis Date  . Anxiety   . Constipation   . Sinus congestion     Past Surgical History:  Procedure Laterality Date  . BREAST EXCISIONAL BIOPSY Bilateral 1976  . DILATION AND CURETTAGE OF UTERUS     x2  . LEEP N/A 06/14/2016   Procedure: LOOP ELECTROSURGICAL EXCISION PROCEDURE (LEEP);  Surgeon: Brayton Mars, MD;  Location: [facility] ORS;  Service: Gynecology;  Laterality: N/A;  . 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 SHOULDER ARTHROSCOPY W/ ROTATOR CUFF REPAIR Right     There were no vitals filed for this visit.      Subjective Assessment - 07/13/16 1715    Subjective Pt reported a LOB while she was on her beach vacation with a "jerking motion" in her shoulder. States that she was concerned she might have torn something and did not perform isometric exercises. Reports no increase in swelling or bruising following incident.   Pertinent History Pt underwent s/p R RTC repair, subacromial decompression and biceps tendonitis (no indication of tenodesis) on 05/17/16. No information regarding how many tendons were repaired however MRI from before surgery reports full thickness anterior supraspinatus tear as well as partial articular surface anterior infraspinatus tear. She reports compliance with use of sling  since surgery. She has not been icing. Has been able to disocontinue use of prescription pain medications. Pt reports compliance with exercises since surgery that she was provided within limitations of protocol    Limitations House hold activities;Other (comment);Lifting   Diagnostic tests Before surgery: MRI reports full thickness anterior supraspinatus tear as well as partial articular surface anterior infraspinatus tear, mild subscapularis tendinosis, superior labral fraying with mild tra-articular long head bicept tendinosis. Small joint effusion, synovitis, and mild subacromial/subdeltoid bursitis   Patient Stated Goals Return to driving, work, and yoga   Currently in Pain? No/denies      Objective:  There ex: UBE 3 minutes frwd/back (warm up). Wall flex/abd x8 ea with ball. AAROM ball roll on wall in flx x10. Wand flex/abd/er AAROM x10 ea. Body blade BUE 1 minute x4 with 90 deg shoulder flex. Ball rolls at 90 deg shoulder flexion for dynamic stabilization (3 x 1 minute).  Manual tx: PROM all planes (2 minutes). Rhythmic stabilizations with 90 deg shoulder flexion full elbow extension. (4 bouts; 1 minute with rest breaks). Pt with no increase in shoulder pain sx but early onset of fatigue.  Pt in seated with ice on R shoulder following tx session (10 minutes).   Pt response for medical necessity: Pt demonstrates good functional mobility and progress per M.D. Protocol. She is currently progressing through [facility] and isometric strengthening exercises with minimal reports of shoulder pain and no tendency for compensatory patterns.  PT Education - 07/13/16 1716    Education provided Yes   Education Details Shoulder isometrics   Person(s) Educated Patient   Methods Explanation;Demonstration;Handout   Comprehension Verbalized understanding;Returned demonstration           PT Long Term Goals - 06/10/16 1627      PT LONG TERM GOAL #1   Title Pt will be independent with HEP in  order to improve strength and ROM as well as decrease pain   Time 8   Period Weeks   Status New     PT LONG TERM GOAL #2   Title Pt will decrease DASH by at least 8 points demonstrating clinically significant reduction in shoulder disability    Baseline 06/10/16: 100/100   Time 8   Period Weeks   Status New     PT LONG TERM GOAL #3   Title Pt will report worst pain on NPRS as 1/10 in order to demonstrate significant reduction in R shoulder pain    Baseline 06/10/16: worst: 4/10   Time 8   Period Weeks   Status New     PT LONG TERM GOAL #4   Title Pt will increase R shoulder AROM so that it is symmetrical in all planes to LUE in order to perform ADLs such as washing/combing hair   Baseline 06/10/16: unable to test due to protocol restrictions   Time 8   Period Weeks   Status New     PT LONG TERM GOAL #5   Title Pt will increase R shoulder flexion, abduction, extension, IR, and ER strength to at least 4-/5 in order to improve function at home and work   Baseline 06/10/16: unable to test due to protocol restrictions   Time 12   Period Weeks   Status New            Plan - 07/13/16 1722    Clinical Impression Statement Pt with initial mild stiffness of R shoulder following apprehension over "jerking motion" while on vacation. She continues to demonstrate functional range with most notable limitation associated with ER (44). She is painfree with isometric exercises and easily fatigued with stabilization ther ex including rhyhtmic stabilization.   Rehab Potential Excellent   Clinical Impairments Affecting Rehab Potential Positive: motivation, age, non-smoker; Negative: extent of repair   PT Frequency 2x / week   PT Duration 8 weeks   PT Treatment/Interventions ADLs/Self Care Home Management;Aquatic Therapy;Electrical Stimulation;Cryotherapy;Iontophoresis 4mg /ml Dexamethasone;Moist Heat;Traction;Ultrasound;Gait training;Therapeutic activities;Therapeutic exercise;Neuromuscular  re-education;Patient/family education;Manual techniques;Dry needling;Passive range of motion;Taping   PT Next Visit Plan Progress PROM and AAROM per protocol, continue pain-free isometrics flexion/extension/ER/IR.    PT Home Exercise Plan Seated scapular retractions, PROM R elbow extension stretch, pendulums for flexion and horizontal abduction/adduction, AAROM pulleys for flexion, submaximal isometrics for flexion, extension, IR, and ER per protocol   Consulted and Agree with Plan of Care Patient      Patient will benefit from skilled therapeutic intervention in order to improve the following deficits and impairments:  Decreased range of motion, Decreased scar mobility, Decreased strength, Increased muscle spasms, Pain  Visit Diagnosis: Stiffness of right shoulder, not elsewhere classified  Pain in right shoulder  Muscle weakness (generalized)     Problem List Patient Active Problem List   Diagnosis Date Noted  . Status post LEEP (loop electrosurgical excision procedure) of cervix 06/23/2016  . Dysplasia of cervix, low grade (CIN 1) 04/29/2016  . 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 Constipation due to slow  transit 02/23/2016  . Hyperlipidemia 06/23/2015  . 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 Anxiety and depression 06/05/15  . ADD (attention deficit disorder) 06/05/2015  . Allergic state 06-05-15  . Family history of sudden death June 05, 2015  . Bloodgood disease 06-05-2015  . Chronic interstitial cystitis 2015-06-05  . Hypochromic microcytic anemia Jun 05, 2015  . Obstructive sleep apnea of adult 06-05-15   Pura Spice, PT, DPT # [id number] Derrill Memo, SPT 07/14/2016, 12:57 PM  [place] [facility] [address] [place], [place], [address] Phone: [phone]   Fax:  [phone]  Name: Lauren Barrera MRN: [id number] Date of Birth: 25-Apr-1961

## 2016-07-15 ENCOUNTER — Ambulatory Visit: Payer: BC Managed Care – PPO | Admitting: Physical Therapy

## 2016-07-15 ENCOUNTER — Encounter: Payer: Self-pay | Admitting: Physical Therapy

## 2016-07-15 DIAGNOSIS — M25611 Stiffness of right shoulder, not elsewhere classified: Secondary | ICD-10-CM | POA: Diagnosis not present

## 2016-07-15 DIAGNOSIS — M6281 Muscle weakness (generalized): Secondary | ICD-10-CM

## 2016-07-15 DIAGNOSIS — M25511 Pain in right shoulder: Secondary | ICD-10-CM

## 2016-07-15 NOTE — Therapy (Signed)
[place] [facility] [address]. [place], [place], [address] Phone: [phone]   Fax:  [phone]  Physical Therapy Treatment  Patient Details  Name: Lauren Barrera MRN: [id number] Date of Birth: January 26, 1961 Referring Provider: Roselee Culver  Encounter Date: 07/15/2016      PT End of Session - 07/15/16 1712    Visit Number 9   Number of Visits 17   Date for PT Re-Evaluation 08/05/16   PT Start Time [id number]   PT Stop Time 1514   PT Time Calculation (min) 43 min   Activity Tolerance Patient tolerated treatment well   Behavior During Therapy [facility] for tasks assessed/performed      Past Medical History:  Diagnosis Date  . Anxiety   . Constipation   . Sinus congestion     Past Surgical History:  Procedure Laterality Date  . BREAST EXCISIONAL BIOPSY Bilateral 1976  . DILATION AND CURETTAGE OF UTERUS     x2  . LEEP N/A 06/14/2016   Procedure: LOOP ELECTROSURGICAL EXCISION PROCEDURE (LEEP);  Surgeon: Brayton Mars, MD;  Location: [facility] ORS;  Service: Gynecology;  Laterality: N/A;  . 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 SHOULDER ARTHROSCOPY W/ ROTATOR CUFF REPAIR Right     There were no vitals filed for this visit.      Subjective Assessment - 07/15/16 1711    Subjective Pt with no new complaints; states that she gave the shoulder a "good work out" this morning with no increase in pain. Reports she is gearing up to return to work in a few weeks.   Pertinent History Pt underwent s/p R RTC repair, subacromial decompression and biceps tendonitis (no indication of tenodesis) on 05/17/16. No information regarding how many tendons were repaired however MRI from before surgery reports full thickness anterior supraspinatus tear as well as partial articular surface anterior infraspinatus tear. She reports compliance with use of sling since surgery. She has not been icing. Has been able to disocontinue use of  prescription pain medications. Pt reports compliance with exercises since surgery that she was provided within limitations of protocol    Limitations House hold activities;Other (comment);Lifting   Diagnostic tests Before surgery: MRI reports full thickness anterior supraspinatus tear as well as partial articular surface anterior infraspinatus tear, mild subscapularis tendinosis, superior labral fraying with mild tra-articular long head bicept tendinosis. Small joint effusion, synovitis, and mild subacromial/subdeltoid bursitis   Patient Stated Goals Return to driving, work, and yoga   Currently in Pain? No/denies      Objective:  There ex: UBE 3 minutes frwd/back (warm up). Wall flex/abd x8 ea with ball. AAROM ball roll on wall in flx x10. Wand flex/abd/er AAROM x10 ea. Ball rolls at 90 deg shoulder flexion for dynamic stabilization (3 x 1 minute). Resisted exercise with yellow theraband including: shoulder ext 2x10, bicep curls 2x10, rows 2x10; pt with onset of shoulder pain with resisted ER/IR this session. Standing wand AAROM IR x20. Seated pulleys with IR emphasis (3 minutes). Rhythmic stabilizations with 90 deg shoulder flexion full elbow extension. (4 bouts; 1 minute with rest breaks). Pt with no increase in shoulder pain sx but early onset of fatigue.  Pt in seated with ice on R shoulder following tx session (10 minutes).   Pt response for medical necessity: Pt demonstrates good functional mobility and progress per M.D. Protocol. She is currently progressing through [facility] and isometric strengthening exercises with minimal reports of shoulder pain  and no tendency for compensatory patterns       PT Education - 07/15/16 1712    Education provided Yes   Education Details resisted shoulder exercises to tolerance.   Person(s) Educated Patient   Methods Explanation;Demonstration;Handout   Comprehension Verbalized understanding;Returned demonstration             PT Long Term Goals  - 06/10/16 1627      PT LONG TERM GOAL #1   Title Pt will be independent with HEP in order to improve strength and ROM as well as decrease pain   Time 8   Period Weeks   Status New     PT LONG TERM GOAL #2   Title Pt will decrease DASH by at least 8 points demonstrating clinically significant reduction in shoulder disability    Baseline 06/10/16: 100/100   Time 8   Period Weeks   Status New     PT LONG TERM GOAL #3   Title Pt will report worst pain on NPRS as 1/10 in order to demonstrate significant reduction in R shoulder pain    Baseline 06/10/16: worst: 4/10   Time 8   Period Weeks   Status New     PT LONG TERM GOAL #4   Title Pt will increase R shoulder AROM so that it is symmetrical in all planes to LUE in order to perform ADLs such as washing/combing hair   Baseline 06/10/16: unable to test due to protocol restrictions   Time 8   Period Weeks   Status New     PT LONG TERM GOAL #5   Title Pt will increase R shoulder flexion, abduction, extension, IR, and ER strength to at least 4-/5 in order to improve function at home and work   Baseline 06/10/16: unable to test due to protocol restrictions   Time 12   Period Weeks   Status New               Plan - 07/15/16 1713    Clinical Impression Statement Pt with good progression of ROM; limitations in IR/ER ROM with end range pain. Demonstrates tolerance of gentle resisted exercises including scapular ext to neutral, rows, and bicep curls.   Rehab Potential Excellent   Clinical Impairments Affecting Rehab Potential Positive: motivation, age, non-smoker; Negative: extent of repair   PT Frequency 2x / week   PT Duration 8 weeks   PT Treatment/Interventions ADLs/Self Care Home Management;Aquatic Therapy;Electrical Stimulation;Cryotherapy;Iontophoresis 4mg /ml Dexamethasone;Moist Heat;Traction;Ultrasound;Gait training;Therapeutic activities;Therapeutic exercise;Neuromuscular re-education;Patient/family education;Manual  techniques;Dry needling;Passive range of motion;Taping   PT Next Visit Plan Progress PROM and AAROM per protocol, continue pain-free isometrics flexion/extension/ER/IR.    PT Home Exercise Plan Seated scapular retractions, PROM R elbow extension stretch, pendulums for flexion and horizontal abduction/adduction, AAROM pulleys for flexion, submaximal isometrics for flexion, extension, IR, and ER per protocol.  UPDATE GOALS,     Consulted and Agree with Plan of Care Patient      Patient will benefit from skilled therapeutic intervention in order to improve the following deficits and impairments:  Decreased range of motion, Decreased scar mobility, Decreased strength, Increased muscle spasms, Pain  Visit Diagnosis: Stiffness of right shoulder, not elsewhere classified  Pain in right shoulder  Muscle weakness (generalized)     Problem List Patient Active Problem List   Diagnosis Date Noted  . Status post LEEP (loop electrosurgical excision procedure) of cervix 06/23/2016  . Dysplasia of cervix, low grade (CIN 1) 04/29/2016  . HSIL on Pap smear of  cervix 04/29/2016  . Constipation due to slow transit 02/23/2016  . Hyperlipidemia 06/23/2015  . 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 Anxiety and depression 06/01/2015  . ADD (attention deficit disorder) 2015-06-01  . Allergic state 2015/06/01  . Family history of sudden death 2015/06/01  . Bloodgood disease Jun 01, 2015  . Chronic interstitial cystitis 2015-06-01  . Hypochromic microcytic anemia 2015/06/01  . Obstructive sleep apnea of adult 06/01/2015   Pura Spice, PT, DPT # [id number] Derrill Memo, SPT 07/16/2016, 2:18 PM  [place] [facility] [address] [place], [place], [address] Phone: [phone]   Fax:  [phone]  Name: Lauren Barrera MRN: [id number] Date of Birth: 1961/09/19

## 2016-07-16 ENCOUNTER — Ambulatory Visit
Admission: RE | Admit: 2016-07-16 | Discharge: 2016-07-16 | Disposition: A | Discharge status: Home / Self Care | Payer: BC Managed Care – PPO | Source: Ambulatory Visit | Attending: Internal Medicine | Admitting: Internal Medicine

## 2016-07-16 ENCOUNTER — Other Ambulatory Visit: Payer: Self-pay | Admitting: Internal Medicine

## 2016-07-16 DIAGNOSIS — Z1231 Encounter for screening mammogram for malignant neoplasm of breast: Secondary | ICD-10-CM

## 2016-07-20 ENCOUNTER — Ambulatory Visit: Payer: BC Managed Care – PPO | Admitting: Physical Therapy

## 2016-07-20 DIAGNOSIS — M25611 Stiffness of right shoulder, not elsewhere classified: Secondary | ICD-10-CM | POA: Diagnosis not present

## 2016-07-20 DIAGNOSIS — M6281 Muscle weakness (generalized): Secondary | ICD-10-CM

## 2016-07-20 DIAGNOSIS — M25511 Pain in right shoulder: Secondary | ICD-10-CM

## 2016-07-20 NOTE — Therapy (Signed)
[place] [facility] [address] [place], [place], [address] Phone: [phone]   Fax:  [phone]  Physical Therapy Treatment  Patient Details  Name: Lauren Barrera MRN: [id number] Date of Birth: 10-05-1961 Referring Provider: Roselee Culver  Encounter Date: 07/20/2016      PT End of Session - 07/20/16 1414    Visit Number 10   Number of Visits 17   Date for PT Re-Evaluation 08/05/16   Authorization Type no g codes   PT Start Time 1740   PT Stop Time 1507   PT Time Calculation (min) 52 min   Activity Tolerance Patient tolerated treatment well   Behavior During Therapy [facility] for tasks assessed/performed      Past Medical History:  Diagnosis Date  . Anxiety   . Constipation   . Sinus congestion     Past Surgical History:  Procedure Laterality Date  . BREAST EXCISIONAL BIOPSY Bilateral 1976  . DILATION AND CURETTAGE OF UTERUS     x2  . LEEP N/A 06/14/2016   Procedure: LOOP ELECTROSURGICAL EXCISION PROCEDURE (LEEP);  Surgeon: Brayton Mars, MD;  Location: [facility] ORS;  Service: Gynecology;  Laterality: N/A;  . 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 SHOULDER ARTHROSCOPY W/ ROTATOR CUFF REPAIR Right     There were no vitals filed for this visit.      Subjective Assessment - 07/20/16 1414    Subjective Pt. states she has been doing well and continues to use R shoulder more and more with daily tasks without limitation.  Pt. reports limitation with shoulder IR/ lifting tasks and sleeping.     Pertinent History Pt underwent s/p R RTC repair, subacromial decompression and biceps tendonitis (no indication of tenodesis) on 05/17/16. No information regarding how many tendons were repaired however MRI from before surgery reports full thickness anterior supraspinatus tear as well as partial articular surface anterior infraspinatus tear. She reports compliance with use of sling since surgery. She has not been icing.  Has been able to disocontinue use of prescription pain medications. Pt reports compliance with exercises since surgery that she was provided within limitations of protocol    Limitations House hold activities;Other (comment);Lifting   Diagnostic tests Before surgery: MRI reports full thickness anterior supraspinatus tear as well as partial articular surface anterior infraspinatus tear, mild subscapularis tendinosis, superior labral fraying with mild tra-articular long head bicept tendinosis. Small joint effusion, synovitis, and mild subacromial/subdeltoid bursitis   Patient Stated Goals Return to driving, work, and yoga   Currently in Pain? No/denies      Objective:  There ex: UBE 3 minutes frwd/back (warm up/ no charge).  Standing B shoulder AAROM with wand flex/abd/ext./ ER/ IR x 20 ea. Personal assistant).  Standing 3# bicep curls 20x,  Reviewed HEP (YTB horiz. Abd.) 20x.  Seated lat. Pull downs no Nautilus: 30# 20x.  Standing body blade (bilateral/ unilateral)- 90 deg. Flexion/ ER/IR/ neutral positioning).  Supine R sh. Serratus punches 30x.  Rhythmic stabs 3x30 sec. Each (moderate resistance and verbal cuing required).    Pt in seated with ice on R shoulder following tx session (10 minutes).   Pt response for medical necessity: Pt demonstrates good functional mobility and progress per M.D. Protocol. She is currently progressing through [facility] and isometric/ light resisted strengthening exercises with minimal reports of shoulder pain and no tendency for compensatory patterns       PT Long Term Goals - 07/21/16 1331  PT LONG TERM GOAL #1   Title Pt will be independent with HEP in order to improve strength and ROM as well as decrease pain   Baseline Independent with HEP   Time 8   Period Weeks   Status Achieved     PT LONG TERM GOAL #2   Title Pt will decrease DASH by at least 8 points demonstrating clinically significant reduction in shoulder disability    Baseline QuickDASH:  34.1% on 8/15   Time 8   Period Weeks   Status Achieved     PT LONG TERM GOAL #3   Title Pt will report worst pain on NPRS as 1/10 in order to demonstrate significant reduction in R shoulder pain    Baseline >3/10 R shoulder pain at worst.    Time 8   Period Weeks   Status Partially Met     PT LONG TERM GOAL #4   Title Pt will increase R shoulder AROM so that it is symmetrical in all planes to LUE in order to perform ADLs such as washing/combing hair   Baseline Full R sh. AROM (pain limited at end-range)   Time 8   Period Weeks   Status Achieved     PT LONG TERM GOAL #5   Title Pt will increase R shoulder flexion, abduction, extension, IR, and ER strength to at least 4-/5 in order to improve function at home and work   Baseline R shoulder strength grossl 4-/5 MMT.     Time 12   Period Weeks   Status Partially Met     Additional Long Term Goals   Additional Long Term Goals Yes     PT LONG TERM GOAL #6   Title Pt. will decrease QuickDASH to <20% to improve return to work without limitations.     Baseline QuickDASH: 34.1% on 8/15   Time 4   Period Weeks   Status New            Plan - 07/20/16 1415    Clinical Impression Statement Pt. has minimal R shoulder muscle soreness prior to tx. session and demonstrating full shoulder AROM all planes (extra time with end-range IR and abd.).  Good technique with HEP.  Pt. instructed to continue with light resisted ex. 2-3x/week in pain-free range.  QuickDASH: 34.1%.  Pt. progressing well towards PT goals.     Rehab Potential Excellent   Clinical Impairments Affecting Rehab Potential Positive: motivation, age, non-smoker; Negative: extent of repair   PT Frequency 2x / week   PT Duration 8 weeks   PT Treatment/Interventions ADLs/Self Care Home Management;Aquatic Therapy;Electrical Stimulation;Cryotherapy;Iontophoresis 85m/ml Dexamethasone;Moist Heat;Traction;Ultrasound;Gait training;Therapeutic activities;Therapeutic  exercise;Neuromuscular re-education;Patient/family education;Manual techniques;Dry needling;Passive range of motion;Taping   PT Next Visit Plan Progress strengthening/ pain-free mobility to promote return to work without limitations.    Consulted and Agree with Plan of Care Patient      Patient will benefit from skilled therapeutic intervention in order to improve the following deficits and impairments:  Decreased range of motion, Decreased scar mobility, Decreased strength, Increased muscle spasms, Pain  Visit Diagnosis: Stiffness of right shoulder, not elsewhere classified  Pain in right shoulder  Muscle weakness (generalized)     Problem List Patient Active Problem List   Diagnosis Date Noted  . Status post LEEP (loop electrosurgical excision procedure) of cervix 06/23/2016  . Dysplasia of cervix, low grade (CIN 1) 04/29/2016  . 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 Constipation due to slow transit 02/23/2016  .  Hyperlipidemia 06/23/2015  . 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 Anxiety and depression 06/06/15  . ADD (attention deficit disorder) 2015-06-06  . Allergic state 2015-06-06  . Family history of sudden death 06-06-15  . Bloodgood disease 06-06-15  . Chronic interstitial cystitis Jun 06, 2015  . Hypochromic microcytic anemia 06-06-2015  . Obstructive sleep apnea of adult June 06, 2015   Pura Spice, PT, DPT # [id number]  07/21/2016, 1:36 PM  [place] [facility] [address] [place], [place], [address] Phone: [phone]   Fax:  [phone]  Name: Lauren Barrera MRN: [id number] Date of Birth: Aug 20, 1961

## 2016-07-22 ENCOUNTER — Ambulatory Visit: Payer: BC Managed Care – PPO | Admitting: Physical Therapy

## 2016-07-22 DIAGNOSIS — M25511 Pain in right shoulder: Secondary | ICD-10-CM

## 2016-07-22 DIAGNOSIS — M6281 Muscle weakness (generalized): Secondary | ICD-10-CM

## 2016-07-22 DIAGNOSIS — M25611 Stiffness of right shoulder, not elsewhere classified: Secondary | ICD-10-CM

## 2016-07-23 NOTE — Therapy (Addendum)
[facility] [address] [place], [place], [address] Phone: [phone]   Fax:  [phone]  Physical Therapy Treatment  Patient Details  Name: Lauren Barrera MRN: [id number] Date of Birth: February 15, 1961 Referring Provider: Roselee Culver  Encounter Date: 07/22/2016      PT End of Session - 07/23/16 1623    Visit Number 11   Number of Visits 17   Date for PT Re-Evaluation 08/05/16   Authorization Type no g codes   PT Start Time 6433   PT Stop Time 1522   PT Time Calculation (min) 57 min   Activity Tolerance Patient tolerated treatment well   Behavior During Therapy [facility] for tasks assessed/performed      Past Medical History:  Diagnosis Date  . Anxiety   . Constipation   . Sinus congestion     Past Surgical History:  Procedure Laterality Date  . BREAST EXCISIONAL BIOPSY Bilateral 1976  . DILATION AND CURETTAGE OF UTERUS     x2  . LEEP N/A 06/14/2016   Procedure: LOOP ELECTROSURGICAL EXCISION PROCEDURE (LEEP);  Surgeon: Brayton Mars, MD;  Location: [facility] ORS;  Service: Gynecology;  Laterality: N/A;  . 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 SHOULDER ARTHROSCOPY W/ ROTATOR CUFF REPAIR Right     There were no vitals filed for this visit.      Subjective Assessment - 07/23/16 1622    Pertinent History Pt underwent s/p R RTC repair, subacromial decompression and biceps tendonitis (no indication of tenodesis) on 05/17/16. No information regarding how many tendons were repaired however MRI from before surgery reports full thickness anterior supraspinatus tear as well as partial articular surface anterior infraspinatus tear. She reports compliance with use of sling since surgery. She has not been icing. Has been able to disocontinue use of prescription pain medications. Pt reports compliance with exercises since surgery that she was provided within limitations of protocol    Limitations House hold  activities;Other (comment);Lifting   Diagnostic tests Before surgery: MRI reports full thickness anterior supraspinatus tear as well as partial articular surface anterior infraspinatus tear, mild subscapularis tendinosis, superior labral fraying with mild tra-articular long head bicept tendinosis. Small joint effusion, synovitis, and mild subacromial/subdeltoid bursitis   Patient Stated Goals Return to driving, work, and yoga   Currently in Pain? No/denies      No new complaints.  Pt. assisted son with spreading mulch/ pulling weeds with no increase c/o shoulder pain but fatigue.  Pt. has ecchymosis/swelling in R shoulder around bra strap.      Objective:  There ex: UBE 3 minutes frwd/back (warm up/ no charge).  Standing B shoulder AROM (3 min.). Standing body blade (bilateral/ unilateral)- varying positions as tolerated for 20 sec. Each x 3. Supine R sh. Serratus punches 30x.  Rhythmic stabs 3x30 sec. Each (moderate resistance and verbal cuing required).  Standing ball sh. Flexion/ walking/ bouncing 5 min.  Wall push-ups 10x2 (good technique).  Nautilus:  30# lat. Pull downs/ 20# tricep ext./ 10# chest press with wand/ 30# scap. Retraction/ 20# bilateral sh. Adduction 20x each.    Pt in seated with ice on R shoulder following tx session (10 minutes).   Pt response for medical necessity: Pt demonstrates good functional mobility and progress per M.D. Protocol. She is currently progressing through AROM and isometric/ light resisted strengthening exercises with minimal reports of shoulder pain and no tendency for compensatory patterns    (+) R  UT muscle tenderness and swelling with ecchymosis noted prior to tx. session.  Bruising noted around R bra line at shoulder and tender to touch.  Pt. doing well with resisted ex. program/ Nautilus ex. with no increase c/o R shoulder pain.  Significant R UT/ trigger point tenderness noted with overpressure/ soft tissue assessement.          PT Long Term  Goals - 07/21/16 1331      PT LONG TERM GOAL #1   Title Pt will be independent with HEP in order to improve strength and ROM as well as decrease pain   Baseline Independent with HEP   Time 8   Period Weeks   Status Achieved     PT LONG TERM GOAL #2   Title Pt will decrease DASH by at least 8 points demonstrating clinically significant reduction in shoulder disability    Baseline QuickDASH: 34.1% on 8/15   Time 8   Period Weeks   Status Achieved     PT LONG TERM GOAL #3   Title Pt will report worst pain on NPRS as 1/10 in order to demonstrate significant reduction in R shoulder pain    Baseline >3/10 R shoulder pain at worst.    Time 8   Period Weeks   Status Partially Met     PT LONG TERM GOAL #4   Title Pt will increase R shoulder AROM so that it is symmetrical in all planes to LUE in order to perform ADLs such as washing/combing hair   Baseline Full R sh. AROM (pain limited at end-range)   Time 8   Period Weeks   Status Achieved     PT LONG TERM GOAL #5   Title Pt will increase R shoulder flexion, abduction, extension, IR, and ER strength to at least 4-/5 in order to improve function at home and work   Baseline R shoulder strength grossl 4-/5 MMT.     Time 12   Period Weeks   Status Partially Met     Additional Long Term Goals   Additional Long Term Goals Yes     PT LONG TERM GOAL #6   Title Pt. will decrease QuickDASH to <20% to improve return to work without limitations.     Baseline QuickDASH: 34.1% on 8/15   Time 4   Period Weeks   Status New               Plan - 07/23/16 1622    Clinical Impairments Affecting Rehab Potential Positive: motivation, age, non-smoker; Negative: extent of repair   PT Frequency 2x / week   PT Duration 8 weeks   PT Treatment/Interventions ADLs/Self Care Home Management;Aquatic Therapy;Electrical Stimulation;Cryotherapy;Iontophoresis 14m/ml Dexamethasone;Moist Heat;Traction;Ultrasound;Gait training;Therapeutic  activities;Therapeutic exercise;Neuromuscular re-education;Patient/family education;Manual techniques;Dry needling;Passive range of motion;Taping   PT Next Visit Plan Progress strengthening/ pain-free mobility to promote return to work without limitations.    PT Home Exercise Plan Seated scapular retractions, PROM R elbow extension stretch, pendulums for flexion and horizontal abduction/adduction, AAROM pulleys for flexion, submaximal isometrics for flexion, extension, IR, and ER per protocol.  UPDATE GOALS,     Consulted and Agree with Plan of Care Patient      Patient will benefit from skilled therapeutic intervention in order to improve the following deficits and impairments:  Decreased range of motion, Decreased scar mobility, Decreased strength, Increased muscle spasms, Pain  Visit Diagnosis: Stiffness of right shoulder, not elsewhere classified  Pain in right shoulder  Muscle weakness (generalized)  Problem List Patient Active Problem List   Diagnosis Date Noted  . Status post LEEP (loop electrosurgical excision procedure) of cervix 06/23/2016  . Dysplasia of cervix, low grade (CIN 1) 04/29/2016  . 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 Constipation due to slow transit 02/23/2016  . Hyperlipidemia 06/23/2015  . 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 Anxiety and depression May 25, 2015  . ADD (attention deficit disorder) 05-25-2015  . Allergic state 25-May-2015  . Family history of sudden death 05/25/2015  . Bloodgood disease 05-25-15  . Chronic interstitial cystitis 2015-05-25  . Hypochromic microcytic anemia May 25, 2015  . Obstructive sleep apnea of adult May 25, 2015   Pura Spice, PT, DPT # [phone]  07/23/2016, 4:25 PM   [facility] [address] [place], [place], [address] Phone: [phone]   Fax:  [phone]  Name: ELISA KUTNER MRN: [id number] Date of Birth: May 19, 1961

## 2016-07-27 ENCOUNTER — Ambulatory Visit: Payer: BC Managed Care – PPO | Admitting: Physical Therapy

## 2016-07-27 DIAGNOSIS — M25511 Pain in right shoulder: Secondary | ICD-10-CM

## 2016-07-27 DIAGNOSIS — M25611 Stiffness of right shoulder, not elsewhere classified: Secondary | ICD-10-CM

## 2016-07-27 DIAGNOSIS — M6281 Muscle weakness (generalized): Secondary | ICD-10-CM

## 2016-07-28 NOTE — Therapy (Signed)
[facility] [address] [place], [place], [address] Phone: [phone]   Fax:  [phone]  Physical Therapy Treatment  Patient Details  Name: IMARA STANDIFORD MRN: [id number] Date of Birth: 09-09-1961 Referring Provider: Roselee Culver  Encounter Date: 07/27/2016      PT End of Session - 07/28/16 1619    Visit Number 12   Number of Visits 17   Date for PT Re-Evaluation 08/05/16   Authorization Type no g codes   PT Start Time 02/15/25   PT Stop Time 1725   PT Time Calculation (min) 59 min   Activity Tolerance Patient tolerated treatment well   Behavior During Therapy [facility] for tasks assessed/performed      Past Medical History:  Diagnosis Date  . Anxiety   . Constipation   . Sinus congestion     Past Surgical History:  Procedure Laterality Date  . BREAST EXCISIONAL BIOPSY Bilateral 1976  . DILATION AND CURETTAGE OF UTERUS     x2  . LEEP N/A 06/14/2016   Procedure: LOOP ELECTROSURGICAL EXCISION PROCEDURE (LEEP);  Surgeon: Brayton Mars, MD;  Location: [facility] ORS;  Service: Gynecology;  Laterality: N/A;  . resection of endocrine tumor  February 15, 2009   benign pancreatic lesion  . SHOULDER ARTHROSCOPY W/ ROTATOR CUFF REPAIR Right     There were no vitals filed for this visit.      Subjective Assessment - 07/28/16 1618    Subjective Pt. states she was sore after last tx./ pulling weeds for son but doing okay today.  No pain reported.  Pt. presents with improvement in R shoulder/bra strap line bruising as compared to last tx. session.  Pt. returned to work last Friday and today with no c/o shoulder issues/ limitations.    Pertinent History Pt underwent s/p R RTC repair, subacromial decompression and biceps tendonitis (no indication of tenodesis) on 05/17/16. No information regarding how many tendons were repaired however MRI from before surgery reports full thickness anterior supraspinatus tear as well as partial articular surface  anterior infraspinatus tear. She reports compliance with use of sling since surgery. She has not been icing. Has been able to disocontinue use of prescription pain medications. Pt reports compliance with exercises since surgery that she was provided within limitations of protocol    Limitations House hold activities;Other (comment);Lifting   Diagnostic tests Before surgery: MRI reports full thickness anterior supraspinatus tear as well as partial articular surface anterior infraspinatus tear, mild subscapularis tendinosis, superior labral fraying with mild tra-articular long head bicept tendinosis. Small joint effusion, synovitis, and mild subacromial/subdeltoid bursitis   Patient Stated Goals Return to driving, work, and yoga   Currently in Pain? No/denies      Objective:  There ex: UBE 3 minutes frwd/back (warm up/ no charge). Standing B shoulder AROM (4 min.). Supine R sh. Serratus punches 30x. Rhythmic stabs 3x30 sec. Each (moderate resistance and verbal cuing required). Nautilus:  30# lat. Pull downs/ 30# tricep ext./ 10# chest press with wand/ 30# scap. Retraction/ 20# bilateral sh. Adduction/ 10# bicep curls/ 20# sh. extension 20x each.  Supine wand 5# press-ups/ shoulder flexion 25x each.  Standing D1/D2 shoulder movement patterns (no weight) 10x each (mirror feedback).  Discussed HEP.    Pt in seated with ice on R shoulder following tx session (10 minutes).   Pt response for medical necessity: Pt demonstrates good functional mobility and progress per M.D. Protocol. She is currently progressing through a progressive resistedstrengthening exercises program with  minimal reports of shoulder pain and no tendency for compensatory patterns       PT Long Term Goals - 07/21/16 1331      PT LONG TERM GOAL #1   Title Pt will be independent with HEP in order to improve strength and ROM as well as decrease pain   Baseline Independent with HEP   Time 8   Period Weeks   Status Achieved      PT LONG TERM GOAL #2   Title Pt will decrease DASH by at least 8 points demonstrating clinically significant reduction in shoulder disability    Baseline QuickDASH: 34.1% on 8/15   Time 8   Period Weeks   Status Achieved     PT LONG TERM GOAL #3   Title Pt will report worst pain on NPRS as 1/10 in order to demonstrate significant reduction in R shoulder pain    Baseline >3/10 R shoulder pain at worst.    Time 8   Period Weeks   Status Partially Met     PT LONG TERM GOAL #4   Title Pt will increase R shoulder AROM so that it is symmetrical in all planes to LUE in order to perform ADLs such as washing/combing hair   Baseline Full R sh. AROM (pain limited at end-range)   Time 8   Period Weeks   Status Achieved     PT LONG TERM GOAL #5   Title Pt will increase R shoulder flexion, abduction, extension, IR, and ER strength to at least 4-/5 in order to improve function at home and work   Baseline R shoulder strength grossl 4-/5 MMT.     Time 12   Period Weeks   Status Partially Met     Additional Long Term Goals   Additional Long Term Goals Yes     PT LONG TERM GOAL #6   Title Pt. will decrease QuickDASH to <20% to improve return to work without limitations.     Baseline QuickDASH: 34.1% on 8/15   Time 4   Period Weeks   Status New            Plan - 07/28/16 1620    Clinical Impression Statement Minimal c/o R UT/posterior deltoid soft tissue tenderness with palpation.  Less verbal cuing to avoid shoulder shrugs/ compensatory movement patterns with standing resisted and Nautilus ex.  Pt. continues to show slow but consistent progress with shoulder stabiity ex./ HEP.     Rehab Potential Excellent   Clinical Impairments Affecting Rehab Potential Positive: motivation, age, non-smoker; Negative: extent of repair   PT Frequency 2x / week   PT Duration 8 weeks   PT Treatment/Interventions ADLs/Self Care Home Management;Aquatic Therapy;Electrical  Stimulation;Cryotherapy;Iontophoresis 7m/ml Dexamethasone;Moist Heat;Traction;Ultrasound;Gait training;Therapeutic activities;Therapeutic exercise;Neuromuscular re-education;Patient/family education;Manual techniques;Dry needling;Passive range of motion;Taping   PT Next Visit Plan Attempt Yoga positions and progress shoulder stability ex.    PT Home Exercise Plan Seated scapular retractions, PROM R elbow extension stretch, pendulums for flexion and horizontal abduction/adduction, AAROM pulleys for flexion, submaximal isometrics for flexion, extension, IR, and ER per protocol.  UPDATE GOALS,     Consulted and Agree with Plan of Care Patient      Patient will benefit from skilled therapeutic intervention in order to improve the following deficits and impairments:  Decreased range of motion, Decreased scar mobility, Decreased strength, Increased muscle spasms, Pain  Visit Diagnosis: Stiffness of right shoulder, not elsewhere classified  Pain in right shoulder  Muscle weakness (generalized)  Problem List Patient Active Problem List   Diagnosis Date Noted  . Status post LEEP (loop electrosurgical excision procedure) of cervix 06/23/2016  . Dysplasia of cervix, low grade (CIN 1) 04/29/2016  . 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 Constipation due to slow transit 02/23/2016  . Hyperlipidemia 06/23/2015  . 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 Anxiety and depression 16-May-2015  . ADD (attention deficit disorder) 2015/05/16  . Allergic state 16-May-2015  . Family history of sudden death 05-16-2015  . Bloodgood disease 05-16-2015  . Chronic interstitial cystitis 16-May-2015  . Hypochromic microcytic anemia 05/16/15  . Obstructive sleep apnea of adult 05-16-2015   Pura Spice, PT, DPT # [phone]  07/28/2016, 4:27 PM  [facility] [address] [place], [place], [address] Phone: [phone]   Fax:  [phone]  Name: ELI ADAMI MRN: [id number] Date of Birth: March 25, 1961

## 2016-07-29 ENCOUNTER — Ambulatory Visit: Payer: BC Managed Care – PPO

## 2016-07-29 DIAGNOSIS — M25611 Stiffness of right shoulder, not elsewhere classified: Secondary | ICD-10-CM

## 2016-07-29 DIAGNOSIS — M25511 Pain in right shoulder: Secondary | ICD-10-CM

## 2016-07-29 DIAGNOSIS — M6281 Muscle weakness (generalized): Secondary | ICD-10-CM

## 2016-07-29 NOTE — Therapy (Signed)
[facility] [address] [place], [place], [address] Phone: [phone]   Fax:  [phone]  Physical Therapy Treatment  Patient Details  Name: Lauren Barrera MRN: [id number] Date of Birth: Feb 20, 1961 Referring Provider: Roselee Culver  Encounter Date: 07/29/2016      PT End of Session - 07/29/16 1807    Visit Number 13   Number of Visits 17   Date for PT Re-Evaluation 08/05/16   PT Start Time 4   PT Stop Time 1718   PT Time Calculation (min) 48 min   Activity Tolerance Patient tolerated treatment well   Behavior During Therapy [facility] for tasks assessed/performed      Past Medical History:  Diagnosis Date  . Anxiety   . Constipation   . Sinus congestion     Past Surgical History:  Procedure Laterality Date  . BREAST EXCISIONAL BIOPSY Bilateral 1976  . DILATION AND CURETTAGE OF UTERUS     x2  . LEEP N/A 06/14/2016   Procedure: LOOP ELECTROSURGICAL EXCISION PROCEDURE (LEEP);  Surgeon: Brayton Mars, MD;  Location: [facility] ORS;  Service: Gynecology;  Laterality: N/A;  . 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 SHOULDER ARTHROSCOPY W/ ROTATOR CUFF REPAIR Right     There were no vitals filed for this visit.      Subjective Assessment - 07/29/16 1805    Subjective Pt with no new complaints; no increased pain in shoulder with increased intensity of exercises. States she will f/u with MD on Tuesday to discuss her progress.   Pertinent History Pt underwent s/p R RTC repair, subacromial decompression and biceps tendonitis (no indication of tenodesis) on 05/17/16. No information regarding how many tendons were repaired however MRI from before surgery reports full thickness anterior supraspinatus tear as well as partial articular surface anterior infraspinatus tear. She reports compliance with use of sling since surgery. She has not been icing. Has been able to disocontinue use of prescription pain  medications. Pt reports compliance with exercises since surgery that she was provided within limitations of protocol    Limitations House hold activities;Other (comment);Lifting   Diagnostic tests Before surgery: MRI reports full thickness anterior supraspinatus tear as well as partial articular surface anterior infraspinatus tear, mild subscapularis tendinosis, superior labral fraying with mild tra-articular long head bicept tendinosis. Small joint effusion, synovitis, and mild subacromial/subdeltoid bursitis   Patient Stated Goals Return to driving, work, and yoga   Currently in Pain? No/denies      Objective:  There ex: Supine serratus punches 3# 3x10. Press ups #3 3x10.  Rhythmic stabs 3x60 sec.(moderate resistance). Nautilus: 30# lat. Pull downs/ 30# tricep ext./ 10# chest press with wand/ 30# scap. Retraction/ 20# bilateral sh. Adduction/ 20# sh. extension 30x each.   Pt in seated with ice on R shoulder following tx session (10 minutes).   Pt response for medical necessity: Pt demonstrates good functional mobility and progress per M.D. Protocol. She is currently progressing through a progressive resistedstrengthening exercises program with minimal reports of shoulder pain and no tendency for compensatory patterns        PT Long Term Goals - 07/21/16 1331      PT LONG TERM GOAL #1   Title Pt will be independent with HEP in order to improve strength and ROM as well as decrease pain   Baseline Independent with HEP   Time 8   Period Weeks   Status Achieved  PT LONG TERM GOAL #2   Title Pt will decrease DASH by at least 8 points demonstrating clinically significant reduction in shoulder disability    Baseline QuickDASH: 34.1% on 8/15   Time 8   Period Weeks   Status Achieved     PT LONG TERM GOAL #3   Title Pt will report worst pain on NPRS as 1/10 in order to demonstrate significant reduction in R shoulder pain    Baseline >3/10 R shoulder pain at worst.    Time 8    Period Weeks   Status Partially Met     PT LONG TERM GOAL #4   Title Pt will increase R shoulder AROM so that it is symmetrical in all planes to LUE in order to perform ADLs such as washing/combing hair   Baseline Full R sh. AROM (pain limited at end-range)   Time 8   Period Weeks   Status Achieved     PT LONG TERM GOAL #5   Title Pt will increase R shoulder flexion, abduction, extension, IR, and ER strength to at least 4-/5 in order to improve function at home and work   Baseline R shoulder strength grossl 4-/5 MMT.     Time 12   Period Weeks   Status Partially Met     Additional Long Term Goals   Additional Long Term Goals Yes     PT LONG TERM GOAL #6   Title Pt. will decrease QuickDASH to <20% to improve return to work without limitations.     Baseline QuickDASH: 34.1% on 8/15   Time 4   Period Weeks   Status New               Plan - 07/29/16 1808    Clinical Impression Statement Pt with no c/o of increase in shoulder pain with resisted exercises but mild muscle fatigue. Pt with good technique for majority of strengthening exercises; tendency for thoracic extension and wrist flexion with standing rows; responsive/corrects with verbal cueing.   Rehab Potential Excellent   Clinical Impairments Affecting Rehab Potential Positive: motivation, age, non-smoker; Negative: extent of repair   PT Frequency 2x / week   PT Duration 8 weeks   PT Treatment/Interventions ADLs/Self Care Home Management;Aquatic Therapy;Electrical Stimulation;Cryotherapy;Iontophoresis 17m/ml Dexamethasone;Moist Heat;Traction;Ultrasound;Gait training;Therapeutic activities;Therapeutic exercise;Neuromuscular re-education;Patient/family education;Manual techniques;Dry needling;Passive range of motion;Taping   PT Next Visit Plan Attempt Yoga positions and progress shoulder stability ex.    PT Home Exercise Plan Seated scapular retractions, PROM R elbow extension stretch, pendulums for flexion and horizontal  abduction/adduction, AAROM pulleys for flexion, submaximal isometrics for flexion, extension, IR, and ER per protocol.  UPDATE GOALS,     Consulted and Agree with Plan of Care Patient      Patient will benefit from skilled therapeutic intervention in order to improve the following deficits and impairments:  Decreased range of motion, Decreased scar mobility, Decreased strength, Increased muscle spasms, Pain  Visit Diagnosis: Stiffness of right shoulder, not elsewhere classified  Pain in right shoulder  Muscle weakness (generalized)     Problem List Patient Active Problem List   Diagnosis Date Noted  . Status post LEEP (loop electrosurgical excision procedure) of cervix 06/23/2016  . Dysplasia of cervix, low grade (CIN 1) 04/29/2016  . 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 Constipation due to slow transit 02/23/2016  . Hyperlipidemia 06/23/2015  . 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 Anxiety and depression 05/15/2015  . ADD (attention deficit disorder) 05/15/2015  . Allergic  state 24-May-2015  . Family history of sudden death 2015/05/24  . Bloodgood disease May 24, 2015  . Chronic interstitial cystitis 05-24-2015  . Hypochromic microcytic anemia May 24, 2015  . Obstructive sleep apnea of adult 05/24/2015   This entire session was performed under direct supervision and direction of a licensed therapist/therapist assistant . I have personally read, edited and approve of the note as written.   Mickel Baas Ayeshia Coppin, SPT [place],Jason DPT 07/30/2016, 12:14 PM   [facility] [address] [place], [place], [address] Phone: [phone]   Fax:  [phone]  Name: Lauren Barrera MRN: [id number] Date of Birth: October 20, 1961

## 2016-08-02 ENCOUNTER — Ambulatory Visit: Payer: BC Managed Care – PPO | Admitting: Physical Therapy

## 2016-08-02 DIAGNOSIS — M25611 Stiffness of right shoulder, not elsewhere classified: Secondary | ICD-10-CM | POA: Diagnosis not present

## 2016-08-02 DIAGNOSIS — M6281 Muscle weakness (generalized): Secondary | ICD-10-CM

## 2016-08-02 DIAGNOSIS — M25511 Pain in right shoulder: Secondary | ICD-10-CM

## 2016-08-02 NOTE — Therapy (Signed)
[facility] [address]. [place], [place], [address] Phone: [phone]   Fax:  [phone]  Physical Therapy Treatment  Patient Details  Name: Lauren Barrera MRN: [id number] Date of Birth: January 29, 1961 Referring Provider: Roselee Culver  Encounter Date: 08/02/2016      PT End of Session - 08/03/16 1815    Visit Number 14   Number of Visits 17   Date for PT Re-Evaluation 08/05/16   PT Start Time 1633   PT Stop Time 1731   PT Time Calculation (min) 58 min   Activity Tolerance Patient tolerated treatment well   Behavior During Therapy [facility] for tasks assessed/performed      Past Medical History:  Diagnosis Date  . Anxiety   . Constipation   . Sinus congestion     Past Surgical History:  Procedure Laterality Date  . BREAST EXCISIONAL BIOPSY Bilateral 1976  . DILATION AND CURETTAGE OF UTERUS     x2  . LEEP N/A 06/14/2016   Procedure: LOOP ELECTROSURGICAL EXCISION PROCEDURE (LEEP);  Surgeon: Brayton Mars, MD;  Location: [facility] ORS;  Service: Gynecology;  Laterality: N/A;  . 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 SHOULDER ARTHROSCOPY W/ ROTATOR CUFF REPAIR Right     There were no vitals filed for this visit.      Subjective Assessment - 08/03/16 1812    Subjective Pt. reports no c/o R shoulder pain currently at rest.  Pt. states she is sore in R shoulder in morning.  No R shoulder issues with return to work.     Pertinent History Pt underwent s/p R RTC repair, subacromial decompression and biceps tendonitis (no indication of tenodesis) on 05/17/16. No information regarding how many tendons were repaired however MRI from before surgery reports full thickness anterior supraspinatus tear as well as partial articular surface anterior infraspinatus tear. She reports compliance with use of sling since surgery. She has not been icing. Has been able to disocontinue use of prescription pain medications. Pt  reports compliance with exercises since surgery that she was provided within limitations of protocol    Limitations House hold activities;Other (comment);Lifting   Diagnostic tests Before surgery: MRI reports full thickness anterior supraspinatus tear as well as partial articular surface anterior infraspinatus tear, mild subscapularis tendinosis, superior labral fraying with mild tra-articular long head bicept tendinosis. Small joint effusion, synovitis, and mild subacromial/subdeltoid bursitis   Patient Stated Goals Return to driving, work, and yoga   Currently in Pain? No/denies       Objective:  There ex:  Nautilus: 40# lat. Pull downs/ 30# tricep ext./ 20# R shoulder diagonal with ext./ 10# chest press with wand/ 30# scap. Retraction/ 20# bilateral sh. Adduction/ 20# sh. extension30x each.  See new HEP (YTB sh. Flexion/ diagonals)- (RTB scap. Retraction/ tricep ext./ door way pec. Stretch).  Seated R shoulder flexion/ abd. AROM with OP to tolerable end-range.  Supine R shoulder manual isometrics IR/ER 10x each with hold (mod. Resistance).      Pt in seated with ice on R shoulder following tx session (10 minutes).   Pt response for medical necessity: Pt demonstrates good functional mobility and progress per M.D. Protocol. She is currently progressing through a progressive resistedstrengthening exercises program with minimal reports of shoulder pain and no tendency for compensatory patterns       PT Long Term Goals - 07/21/16 1331      PT LONG TERM GOAL #1  Title Pt will be independent with HEP in order to improve strength and ROM as well as decrease pain   Baseline Independent with HEP   Time 8   Period Weeks   Status Achieved     PT LONG TERM GOAL #2   Title Pt will decrease DASH by at least 8 points demonstrating clinically significant reduction in shoulder disability    Baseline QuickDASH: 34.1% on 8/15   Time 8   Period Weeks   Status Achieved     PT LONG TERM GOAL #3    Title Pt will report worst pain on NPRS as 1/10 in order to demonstrate significant reduction in R shoulder pain    Baseline >3/10 R shoulder pain at worst.    Time 8   Period Weeks   Status Partially Met     PT LONG TERM GOAL #4   Title Pt will increase R shoulder AROM so that it is symmetrical in all planes to LUE in order to perform ADLs such as washing/combing hair   Baseline Full R sh. AROM (pain limited at end-range)   Time 8   Period Weeks   Status Achieved     PT LONG TERM GOAL #5   Title Pt will increase R shoulder flexion, abduction, extension, IR, and ER strength to at least 4-/5 in order to improve function at home and work   Baseline R shoulder strength grossl 4-/5 MMT.     Time 12   Period Weeks   Status Partially Met     Additional Long Term Goals   Additional Long Term Goals Yes     PT LONG TERM GOAL #6   Title Pt. will decrease QuickDASH to <20% to improve return to work without limitations.     Baseline QuickDASH: 34.1% on 8/15   Time 4   Period Weeks   Status New           Plan - 08/03/16 1815    Clinical Impression Statement PT discussed Yoga posture/ positioning with R shoulder (avoid pain provoking ex.).  Pt. able to demonstrate resisted ex. with YTB/RTB and proper technique with no R UT compensatory movement patterns.  Pt. benefit from R shoulder AROM to end-range and progression of strengthening/stability ex.  Probable recert next tx. session with decrease frequency and more focus on gym based/HEP.     Rehab Potential Excellent   Clinical Impairments Affecting Rehab Potential Positive: motivation, age, non-smoker; Negative: extent of repair   PT Frequency 2x / week   PT Duration 8 weeks   PT Treatment/Interventions ADLs/Self Care Home Management;Aquatic Therapy;Electrical Stimulation;Cryotherapy;Iontophoresis 67m/ml Dexamethasone;Moist Heat;Traction;Ultrasound;Gait training;Therapeutic activities;Therapeutic exercise;Neuromuscular  re-education;Patient/family education;Manual techniques;Dry needling;Passive range of motion;Taping   PT Next Visit Plan Attempt Yoga positions and progress shoulder stability ex.  RECERT vs. DISCHARGE next tx.    PT Home Exercise Plan Seated scapular retractions, PROM R elbow extension stretch, pendulums for flexion and horizontal abduction/adduction, AAROM pulleys for flexion, submaximal isometrics for flexion, extension, IR, and ER per protocol.         Patient will benefit from skilled therapeutic intervention in order to improve the following deficits and impairments:  Decreased range of motion, Decreased scar mobility, Decreased strength, Increased muscle spasms, Pain  Visit Diagnosis: Stiffness of right shoulder, not elsewhere classified  Pain in right shoulder  Muscle weakness (generalized)     Problem List Patient Active Problem List   Diagnosis Date Noted  . Status post LEEP (loop electrosurgical excision procedure) of cervix  06/23/2016  . Dysplasia of cervix, low grade (CIN 1) 04/29/2016  . 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 Constipation due to slow transit 02/23/2016  . Hyperlipidemia 06/23/2015  . 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 Anxiety and depression 2015-05-22  . ADD (attention deficit disorder) 2015-05-22  . Allergic state 2015/05/22  . Family history of sudden death 2015/05/22  . Bloodgood disease 05/22/2015  . Chronic interstitial cystitis 2015/05/22  . Hypochromic microcytic anemia 05-22-15  . Obstructive sleep apnea of adult 05-22-15   Pura Spice, PT, DPT # [id number]  08/03/2016, 6:19 PM  [facility] [address] [place], [place], [address] Phone: [phone]   Fax:  [phone]  Name: ANISHA STARLIPER MRN: [id number] Date of Birth: 1961/08/04

## 2016-08-03 ENCOUNTER — Encounter: Payer: Self-pay | Admitting: Physical Therapy

## 2016-08-04 ENCOUNTER — Ambulatory Visit: Payer: BC Managed Care – PPO | Admitting: Physical Therapy

## 2016-08-04 ENCOUNTER — Encounter: Payer: Self-pay | Admitting: Physical Therapy

## 2016-08-04 DIAGNOSIS — M25511 Pain in right shoulder: Secondary | ICD-10-CM

## 2016-08-04 DIAGNOSIS — M6281 Muscle weakness (generalized): Secondary | ICD-10-CM

## 2016-08-04 DIAGNOSIS — M25611 Stiffness of right shoulder, not elsewhere classified: Secondary | ICD-10-CM | POA: Diagnosis not present

## 2016-08-04 NOTE — Therapy (Signed)
[facility] [address] [place], [place], [address] Phone: [phone]   Fax:  [phone]  Physical Therapy Treatment  Patient Details  Name: Lauren Barrera MRN: [id number] Date of Birth: 1961/01/14 Referring Provider: Roselee Culver  Encounter Date: 08/04/2016      PT End of Session - 08/04/16 1736    Visit Number 15   Number of Visits 24   Date for PT Re-Evaluation 09/01/16   PT Start Time 1636   PT Stop Time 1731   PT Time Calculation (min) 55 min   Activity Tolerance Patient tolerated treatment well;Patient limited by pain   Behavior During Therapy [facility] for tasks assessed/performed      Past Medical History:  Diagnosis Date  . Anxiety   . Constipation   . Sinus congestion     Past Surgical History:  Procedure Laterality Date  . BREAST EXCISIONAL BIOPSY Bilateral 1976  . DILATION AND CURETTAGE OF UTERUS     x2  . LEEP N/A 06/14/2016   Procedure: LOOP ELECTROSURGICAL EXCISION PROCEDURE (LEEP);  Surgeon: Brayton Mars, MD;  Location: [facility] ORS;  Service: Gynecology;  Laterality: N/A;  . 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 SHOULDER ARTHROSCOPY W/ ROTATOR CUFF REPAIR Right     There were no vitals filed for this visit.      Subjective Assessment - 08/04/16 1733    Subjective Pt states she saw her MD on Tuesday; states he was pleased with her progress and would like her to be at at least 1/2 strength on RUE before she returns to yoga. Pt states she is feeling mild stiffness at session onset but no pain.   Pertinent History Pt underwent s/p R RTC repair, subacromial decompression and biceps tendonitis (no indication of tenodesis) on 05/17/16. No information regarding how many tendons were repaired however MRI from before surgery reports full thickness anterior supraspinatus tear as well as partial articular surface anterior infraspinatus tear. She reports compliance with use of sling since  surgery. She has not been icing. Has been able to disocontinue use of prescription pain medications. Pt reports compliance with exercises since surgery that she was provided within limitations of protocol    Limitations House hold activities;Other (comment);Lifting   Diagnostic tests Before surgery: MRI reports full thickness anterior supraspinatus tear as well as partial articular surface anterior infraspinatus tear, mild subscapularis tendinosis, superior labral fraying with mild tra-articular long head bicept tendinosis. Small joint effusion, synovitis, and mild subacromial/subdeltoid bursitis   Patient Stated Goals Return to driving, work, and yoga   Currently in Pain? No/denies      Objective:  Therapeutic Exercise: UBE f/b 3 minutes ea (warm up/no charge). Lat pull downs #30 3x10. Tricep ext #20 2x10. Pulleys IR emphasis (2 minutes). ER with OP applied by wand (3 minutes). Seated scaption #1 2x10. D1/D2 with YTB resistance 2x10 ea. Horizontal abduction with YTB 2x10.   Ice applied at end of exercise in sitting for 5 minutes.  Pt response for medical necessity: Pt demonstrates consistent progress through MD protocol with remaining limitations in ER/IR mobility and strength especially in scapular plane. Mild c/o of R shoulder plane with MMT of R shoulder in flexion/abduction. Pt with good tolerance of resisted exercise this session.      PT Education - 08/04/16 1735    Education provided Yes   Education Details ROM exercises with IR/ER emphasis   Person(s) Educated Patient   Methods Explanation;Demonstration;Handout  Comprehension Verbalized understanding;Returned demonstration             PT Long Term Goals - 08/05/16 0839      PT LONG TERM GOAL #1   Title Pt will be independent with HEP in order to improve strength and ROM as well as decrease pain   Baseline Independent with HEP   Time 4   Period Weeks   Status Achieved     PT LONG TERM GOAL #2   Title Pt will decrease  DASH by at least 8 points demonstrating clinically significant reduction in shoulder disability    Baseline QuickDASH: 34.1% on 8/15   Time 4   Period Weeks   Status Achieved     PT LONG TERM GOAL #3   Title Pt will report worst pain on NPRS as 1/10 in order to demonstrate significant reduction in R shoulder pain    Baseline >3/10 R shoulder pain at worst.    Time 4   Period Weeks   Status Partially Met     PT LONG TERM GOAL #4   Title Pt will increase R shoulder AROM so that it is symmetrical in all planes to LUE in order to perform ADLs such as washing/combing hair   Baseline Full R sh. AROM (pain limited at end-range)   Time 4   Period Weeks   Status Achieved     PT LONG TERM GOAL #5   Title Pt will increase R shoulder flexion, abduction, extension, IR, and ER strength to at least 4-/5 in order to improve function at home and work   Baseline ROM: R/L ER: 41/90, IR: 83/95. She experiences end range pain with IR/ER. Strength: MMT R/L shoulder flexion 4- (pain limited)/4+, abduction 4 (pain limited with compensatory shrug)/4+, biceps 5/5 (mild pain with R), tricep 5/5.    Time 4   Period Weeks   Status Partially Met     PT LONG TERM GOAL #6   Title Pt. will decrease QuickDASH to <20% to improve return to work without limitations.     Baseline QuickDASH: 34.1% on 8/15   Time 4   Period Weeks   Status On-going            Plan - 08/04/16 1737    Clinical Impression Statement Pt demonstrates decreased ROM in RUE most notably in IR/ER. ROM: R/L ER: 41/90, IR: 83/95. She experiences end range pain with IR/ER. Strength: MMT R/L shoulder flexion 4- (pain limited)/4+, abduction 4 (pain limited with compensatory shrug)/4+, biceps 5/5 (mild pain with R), tricep 5/5. Pt demonstrates good progression through MD protocol with mild limitations in ROM and strength. She will benefit from continued skilled therapy to progress strengthening/mobility program so she can return to PLOF.                           Rehab Potential Excellent   Clinical Impairments Affecting Rehab Potential Positive: motivation, age, non-smoker; Negative: extent of repair   PT Frequency 2x / week   PT Duration 4 weeks   PT Treatment/Interventions ADLs/Self Care Home Management;Aquatic Therapy;Electrical Stimulation;Cryotherapy;Iontophoresis '4mg'$ /ml Dexamethasone;Moist Heat;Traction;Ultrasound;Gait training;Therapeutic activities;Therapeutic exercise;Neuromuscular re-education;Patient/family education;Manual techniques;Dry needling;Passive range of motion;Taping   PT Next Visit Plan Attempt Yoga positions and progress shoulder stability ex. Strengthen scapular plane.     PT Home Exercise Plan Seated scapular retractions, PROM R elbow extension stretch, pendulums for flexion and horizontal abduction/adduction, AAROM pulleys for flexion, submaximal isometrics for flexion, extension, IR, and  ER per protocol.      Consulted and Agree with Plan of Care Patient      Patient will benefit from skilled therapeutic intervention in order to improve the following deficits and impairments:  Decreased range of motion, Decreased scar mobility, Decreased strength, Increased muscle spasms, Pain  Visit Diagnosis: Stiffness of right shoulder, not elsewhere classified  Pain in right shoulder  Muscle weakness (generalized)     Problem List Patient Active Problem List   Diagnosis Date Noted  . Status post LEEP (loop electrosurgical excision procedure) of cervix 06/23/2016  . Dysplasia of cervix, low grade (CIN 1) 04/29/2016  . 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 Constipation due to slow transit 02/23/2016  . Hyperlipidemia 06/23/2015  . 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 Anxiety and depression 20-May-2015  . ADD (attention deficit disorder) May 20, 2015  . Allergic state May 20, 2015  . Family history of sudden death 05/20/15  . Bloodgood disease 05/20/15  . Chronic interstitial cystitis May 20, 2015  .  Hypochromic microcytic anemia 2015-05-20  . Obstructive sleep apnea of adult 2015-05-20   Pura Spice, PT, DPT # [id number] Derrill Memo, SPT 08/05/2016, 8:41 AM  [facility] [address] [place], [place], [address] Phone: [phone]   Fax:  [phone]  Name: Lauren Barrera MRN: [id number] Date of Birth: [birth year]

## 2016-08-10 ENCOUNTER — Ambulatory Visit: Payer: BC Managed Care – PPO | Admitting: Physical Therapy

## 2016-08-12 ENCOUNTER — Ambulatory Visit: Payer: BC Managed Care – PPO | Attending: Orthopedic Surgery | Admitting: Physical Therapy

## 2016-08-12 ENCOUNTER — Encounter: Payer: Self-pay | Admitting: Physical Therapy

## 2016-08-12 DIAGNOSIS — M25611 Stiffness of right shoulder, not elsewhere classified: Secondary | ICD-10-CM | POA: Diagnosis present

## 2016-08-12 DIAGNOSIS — M25511 Pain in right shoulder: Secondary | ICD-10-CM | POA: Diagnosis present

## 2016-08-12 DIAGNOSIS — M6281 Muscle weakness (generalized): Secondary | ICD-10-CM | POA: Diagnosis present

## 2016-08-12 NOTE — Therapy (Signed)
[place] [facility] [address] [place], [place], [address] Phone: [phone]   Fax:  [phone]  Physical Therapy Treatment  Patient Details  Name: Lauren Barrera MRN: [id number] Date of Birth: 09/27/1961 Referring Provider: Roselee Culver  Encounter Date: 08/12/2016      PT End of Session - 08/12/16 1845    Visit Number 16   Number of Visits 24   Date for PT Re-Evaluation 09/01/16   PT Start Time 1622   PT Stop Time 1712   PT Time Calculation (min) 50 min   Activity Tolerance Patient tolerated treatment well   Behavior During Therapy [facility] for tasks assessed/performed      Past Medical History:  Diagnosis Date  . Anxiety   . Constipation   . Sinus congestion     Past Surgical History:  Procedure Laterality Date  . BREAST EXCISIONAL BIOPSY Bilateral 1976  . DILATION AND CURETTAGE OF UTERUS     x2  . LEEP N/A 06/14/2016   Procedure: LOOP ELECTROSURGICAL EXCISION PROCEDURE (LEEP);  Surgeon: Brayton Mars, MD;  Location: [facility] ORS;  Service: Gynecology;  Laterality: N/A;  . 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 SHOULDER ARTHROSCOPY W/ ROTATOR CUFF REPAIR Right     There were no vitals filed for this visit.      Subjective Assessment - 08/12/16 1843    Subjective Pt continues to have difficulty with shoulder IR ROM, states she has difficulty donning/doffing tight fitting clothing due to ROM deficit. Pt states she has had no pain in her shoulder since last physical therapy visit; she would like to return to yoga at the beginning of Oct. States she practice several low intensity yoga exercises with no reported shoulder pain.   Pertinent History Pt underwent s/p R RTC repair, subacromial decompression and biceps tendonitis (no indication of tenodesis) on 05/17/16. No information regarding how many tendons were repaired however MRI from before surgery reports full thickness anterior supraspinatus tear as  well as partial articular surface anterior infraspinatus tear. She reports compliance with use of sling since surgery. She has not been icing. Has been able to disocontinue use of prescription pain medications. Pt reports compliance with exercises since surgery that she was provided within limitations of protocol    Limitations House hold activities;Other (comment);Lifting   Diagnostic tests Before surgery: MRI reports full thickness anterior supraspinatus tear as well as partial articular surface anterior infraspinatus tear, mild subscapularis tendinosis, superior labral fraying with mild tra-articular long head bicept tendinosis. Small joint effusion, synovitis, and mild subacromial/subdeltoid bursitis   Patient Stated Goals Return to driving, work, and yoga   Currently in Pain? No/denies     Objective:   Therapeutic Exercise: UBE f/b 3 minutes ea (warm up/no charge). Horizontal abduction RTB x30. Diagonal D1/D2 RTB x20 ea. Shoulder flexion #1 x30. Shoulder scaption #1 x30. Nautilus: Shoulder extension #20 3x10. Shoulder adduction #20 3x10. Tricep extension #20 3x10. Chest press #10 3x10.  Ice applied to R shoulder with pt in sitting for 5 minutes following therapy session.   Pt response for medical necessity: Pt demonstrates good tolerance of UE strengthening exercises with no c/o of pain. She experiences early onset of fatigue especially with shoulder flexion/scaption exercises. With fatigue she demonstrates tendency for mild R shoulder shrug but is able to correct shrug following standing rest break.       PT Education - 08/12/16 1845    Education provided Yes  Education Details See patient instructions: weighted wand shoulder flexion and scaption #1   Person(s) Educated Patient   Methods Explanation;Demonstration;Handout   Comprehension Verbalized understanding;Returned demonstration             PT Long Term Goals - 08/05/16 0839      PT LONG TERM GOAL #1   Title Pt will be  independent with HEP in order to improve strength and ROM as well as decrease pain   Baseline Independent with HEP   Time 4   Period Weeks   Status Achieved     PT LONG TERM GOAL #2   Title Pt will decrease DASH by at least 8 points demonstrating clinically significant reduction in shoulder disability    Baseline QuickDASH: 34.1% on 8/15   Time 4   Period Weeks   Status Achieved     PT LONG TERM GOAL #3   Title Pt will report worst pain on NPRS as 1/10 in order to demonstrate significant reduction in R shoulder pain    Baseline >3/10 R shoulder pain at worst.    Time 4   Period Weeks   Status Partially Met     PT LONG TERM GOAL #4   Title Pt will increase R shoulder AROM so that it is symmetrical in all planes to LUE in order to perform ADLs such as washing/combing hair   Baseline Full R sh. AROM (pain limited at end-range)   Time 4   Period Weeks   Status Achieved     PT LONG TERM GOAL #5   Title Pt will increase R shoulder flexion, abduction, extension, IR, and ER strength to at least 4-/5 in order to improve function at home and work   Baseline ROM: R/L ER: 41/90, IR: 83/95. She experiences end range pain with IR/ER. Strength: MMT R/L shoulder flexion 4- (pain limited)/4+, abduction 4 (pain limited with compensatory shrug)/4+, biceps 5/5 (mild pain with R), tricep 5/5.    Time 4   Period Weeks   Status Partially Met     PT LONG TERM GOAL #6   Title Pt. will decrease QuickDASH to <20% to improve return to work without limitations.     Baseline QuickDASH: 34.1% on 8/15   Time 4   Period Weeks   Status On-going               Plan - 08/12/16 1848    Clinical Impression Statement Pt is limited in R shoulder IR as compared to L shoulder; she achieves comparable shoulder flexion AROM bilaterally although continues to display mild deficit in RUE range. She is able to perform weighted shoulder flexion and scaption with no increase in pain but displays mild R shoulder  shrug with fatigue.   Rehab Potential Excellent   Clinical Impairments Affecting Rehab Potential Positive: motivation, age, non-smoker; Negative: extent of repair   PT Frequency 2x / week   PT Duration 4 weeks   PT Treatment/Interventions ADLs/Self Care Home Management;Aquatic Therapy;Electrical Stimulation;Cryotherapy;Iontophoresis 35m/ml Dexamethasone;Moist Heat;Traction;Ultrasound;Gait training;Therapeutic activities;Therapeutic exercise;Neuromuscular re-education;Patient/family education;Manual techniques;Dry needling;Passive range of motion;Taping   PT Next Visit Plan Attempt Yoga positions and progress shoulder stability ex. Strengthen scapular plane.     PT Home Exercise Plan Seated scapular retractions, PROM R elbow extension stretch, pendulums for flexion and horizontal abduction/adduction, AAROM pulleys for flexion, submaximal isometrics for flexion, extension, IR, and ER per protocol.      Consulted and Agree with Plan of Care Patient      Patient will  benefit from skilled therapeutic intervention in order to improve the following deficits and impairments:  Decreased range of motion, Decreased scar mobility, Decreased strength, Increased muscle spasms, Pain  Visit Diagnosis: Stiffness of right shoulder, not elsewhere classified  Pain in right shoulder  Muscle weakness (generalized)     Problem List Patient Active Problem List   Diagnosis Date Noted  . Status post LEEP (loop electrosurgical excision procedure) of cervix 06/23/2016  . Dysplasia of cervix, low grade (CIN 1) 04/29/2016  . 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 Constipation due to slow transit 02/23/2016  . Hyperlipidemia 06/23/2015  . 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 Anxiety and depression 2015/05/19  . ADD (attention deficit disorder) 05/19/15  . Allergic state 2015/05/19  . Family history of sudden death 2015-05-19  . Bloodgood disease May 19, 2015  . Chronic interstitial cystitis May 19, 2015   . Hypochromic microcytic anemia 2015/05/19  . Obstructive sleep apnea of adult May 19, 2015   Pura Spice, PT, DPT # [id number] Mickel Baas Azaryah Oleksy SPT 08/12/2016, 6:52 PM  [place] [facility] [address] [place], [place], [address] Phone: [phone]   Fax:  [phone]  Name: Lauren Barrera MRN: [id number] Date of Birth: 09/04/1961

## 2016-08-17 ENCOUNTER — Ambulatory Visit: Payer: BC Managed Care – PPO | Admitting: Physical Therapy

## 2016-08-17 DIAGNOSIS — M25611 Stiffness of right shoulder, not elsewhere classified: Secondary | ICD-10-CM

## 2016-08-17 DIAGNOSIS — M6281 Muscle weakness (generalized): Secondary | ICD-10-CM

## 2016-08-17 DIAGNOSIS — M25511 Pain in right shoulder: Secondary | ICD-10-CM

## 2016-08-17 NOTE — Therapy (Signed)
[facility] [address]. [place], [place], [address] Phone: [phone]   Fax:  [phone]  Physical Therapy Treatment  Patient Details  Name: Lauren Barrera MRN: [id number] Date of Birth: 10-22-1961 Referring Provider: Roselee Culver  Encounter Date: 08/17/2016      PT End of Session - 08/18/16 0952    Visit Number 17   Number of Visits 24   Date for PT Re-Evaluation 09/01/16   PT Start Time 1644   PT Stop Time 1739   PT Time Calculation (min) 55 min   Activity Tolerance Patient tolerated treatment well   Behavior During Therapy [facility] for tasks assessed/performed      Past Medical History:  Diagnosis Date  . Anxiety   . Constipation   . Sinus congestion     Past Surgical History:  Procedure Laterality Date  . BREAST EXCISIONAL BIOPSY Bilateral 1976  . DILATION AND CURETTAGE OF UTERUS     x2  . LEEP N/A 06/14/2016   Procedure: LOOP ELECTROSURGICAL EXCISION PROCEDURE (LEEP);  Surgeon: Brayton Mars, MD;  Location: [facility] ORS;  Service: Gynecology;  Laterality: N/A;  . 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 SHOULDER ARTHROSCOPY W/ ROTATOR CUFF REPAIR Right     There were no vitals filed for this visit.      Subjective Assessment - 08/18/16 0935    Subjective Pt. reports no pain.  Pt. states she is completing HEP and discussed returning to Yoga program.     Pertinent History Pt underwent s/p R RTC repair, subacromial decompression and biceps tendonitis (no indication of tenodesis) on 05/17/16. No information regarding how many tendons were repaired however MRI from before surgery reports full thickness anterior supraspinatus tear as well as partial articular surface anterior infraspinatus tear. She reports compliance with use of sling since surgery. She has not been icing. Has been able to disocontinue use of prescription pain medications. Pt reports compliance with exercises since surgery that  she was provided within limitations of protocol    Limitations House hold activities;Other (comment);Lifting   Diagnostic tests Before surgery: MRI reports full thickness anterior supraspinatus tear as well as partial articular surface anterior infraspinatus tear, mild subscapularis tendinosis, superior labral fraying with mild tra-articular long head bicept tendinosis. Small joint effusion, synovitis, and mild subacromial/subdeltoid bursitis   Patient Stated Goals Return to driving, work, and yoga   Currently in Pain? No/denies      Therapeutic Exercise: UBE f/b 3 minutes ea (warm up/no charge).  Standing B shoulder flexion/ abd./ ER/ IR 5x all planes (pain tolerable range with mirror feedback).  Supine B shoulder horizontal abduction x20. Supine PNF D1/D2 20x each (no wt.). Shoulder flexion/ press-ups/ serratus punches/ tricep ext./ bicep curls 2# x20.  Nautilus: Standing lat. Pull downs 40#/ Shoulder extension 20#/ Shoulder adduction 30#/ Tricep extension 30#/ Chest press with wand 20#/ scap. Retraction 30# 20x each.  Discussed decreasing frequency of icing to R shoulder.     Pt response for medical necessity: Pt demonstrates good tolerance of UE strengthening exercises with no c/o of pain. She experiences early onset of fatigue especially with shoulder flexion/scaption exercises.  Progressing well with R shoulder stability ex. Program with min. To no compensatory strategies.          PT Long Term Goals - 08/05/16 0839      PT LONG TERM GOAL #1   Title Pt will be independent with HEP in order to  improve strength and ROM as well as decrease pain   Baseline Independent with HEP   Time 4   Period Weeks   Status Achieved     PT LONG TERM GOAL #2   Title Pt will decrease DASH by at least 8 points demonstrating clinically significant reduction in shoulder disability    Baseline QuickDASH: 34.1% on 8/15   Time 4   Period Weeks   Status Achieved     PT LONG TERM GOAL #3   Title Pt will  report worst pain on NPRS as 1/10 in order to demonstrate significant reduction in R shoulder pain    Baseline >3/10 R shoulder pain at worst.    Time 4   Period Weeks   Status Partially Met     PT LONG TERM GOAL #4   Title Pt will increase R shoulder AROM so that it is symmetrical in all planes to LUE in order to perform ADLs such as washing/combing hair   Baseline Full R sh. AROM (pain limited at end-range)   Time 4   Period Weeks   Status Achieved     PT LONG TERM GOAL #5   Title Pt will increase R shoulder flexion, abduction, extension, IR, and ER strength to at least 4-/5 in order to improve function at home and work   Baseline ROM: R/L ER: 41/90, IR: 83/95. She experiences end range pain with IR/ER. Strength: MMT R/L shoulder flexion 4- (pain limited)/4+, abduction 4 (pain limited with compensatory shrug)/4+, biceps 5/5 (mild pain with R), tricep 5/5.    Time 4   Period Weeks   Status Partially Met     PT LONG TERM GOAL #6   Title Pt. will decrease QuickDASH to <20% to improve return to work without limitations.     Baseline QuickDASH: 34.1% on 8/15   Time 4   Period Weeks   Status On-going           Plan - 08/18/16 0953    Clinical Impression Statement Pt. has minimal difficulty with reaching across body with R UE to grab zipper on L side of pants and requires extra time to don/doff shirt.  Pt. demonstrates full R shoulder flexion/ abd. in standing and remains limited by joint stiffness/ discomfort with end-range IR.  Pt. will benefit from focus on R shoulder/ scapular strengthening to promte return to all functional tasks/ exercise.      Rehab Potential Excellent   Clinical Impairments Affecting Rehab Potential Positive: motivation, age, non-smoker; Negative: extent of repair   PT Frequency 2x / week   PT Duration 4 weeks   PT Treatment/Interventions ADLs/Self Care Home Management;Aquatic Therapy;Electrical Stimulation;Cryotherapy;Iontophoresis 35m/ml Dexamethasone;Moist  Heat;Traction;Ultrasound;Gait training;Therapeutic activities;Therapeutic exercise;Neuromuscular re-education;Patient/family education;Manual techniques;Dry needling;Passive range of motion;Taping   PT Next Visit Plan Attempt Yoga positions and progress shoulder stability ex. Strengthen scapular plane.     PT Home Exercise Plan Seated scapular retractions, PROM R elbow extension stretch, pendulums for flexion and horizontal abduction/adduction, AAROM pulleys for flexion, submaximal isometrics for flexion, extension, IR, and ER per protocol.      Consulted and Agree with Plan of Care Patient      Patient will benefit from skilled therapeutic intervention in order to improve the following deficits and impairments:  Decreased range of motion, Decreased scar mobility, Decreased strength, Increased muscle spasms, Pain, Hypomobility  Visit Diagnosis: Stiffness of right shoulder, not elsewhere classified  Pain in right shoulder  Muscle weakness (generalized)     Problem List Patient  Active Problem List   Diagnosis Date Noted  . Status post LEEP (loop electrosurgical excision procedure) of cervix 06/23/2016  . Dysplasia of cervix, low grade (CIN 1) 04/29/2016  . 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 Constipation due to slow transit 02/23/2016  . Hyperlipidemia 06/23/2015  . 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 Anxiety and depression 2015/06/02  . ADD (attention deficit disorder) 02-Jun-2015  . Allergic state 2015-06-02  . Family history of sudden death 06/02/2015  . Bloodgood disease 06-02-15  . Chronic interstitial cystitis Jun 02, 2015  . Hypochromic microcytic anemia 06-02-2015  . Obstructive sleep apnea of adult 2015/06/02   Pura Spice, PT, DPT # [id number] 08/18/2016, 9:58 AM  [place] [facility] [address] [place], [place], [address] Phone: [phone]   Fax:  [phone]  Name: Lauren Barrera MRN: [id number] Date of Birth:  1961-05-09

## 2016-08-18 ENCOUNTER — Encounter: Payer: Self-pay | Admitting: Physical Therapy

## 2016-08-19 ENCOUNTER — Ambulatory Visit: Payer: BC Managed Care – PPO | Admitting: Physical Therapy

## 2016-08-19 ENCOUNTER — Encounter: Payer: Self-pay | Admitting: Physical Therapy

## 2016-08-19 DIAGNOSIS — M25611 Stiffness of right shoulder, not elsewhere classified: Secondary | ICD-10-CM

## 2016-08-19 DIAGNOSIS — M6281 Muscle weakness (generalized): Secondary | ICD-10-CM

## 2016-08-19 DIAGNOSIS — M25511 Pain in right shoulder: Secondary | ICD-10-CM

## 2016-08-19 NOTE — Therapy (Signed)
[facility] [address] [place], [place], [address] Phone: [phone]   Fax:  [phone]  Physical Therapy Treatment  Patient Details  Name: DEAZIA LAMPI MRN: [id number] Date of Birth: 06/18/1961 Referring Provider: Roselee Culver  Encounter Date: 08/19/2016      PT End of Session - 08/19/16 1810    Visit Number 18   Number of Visits 24   Date for PT Re-Evaluation 09/01/16   PT Start Time 6606   PT Stop Time 1748   PT Time Calculation (min) 55 min   Activity Tolerance Patient tolerated treatment well   Behavior During Therapy [facility] for tasks assessed/performed      Past Medical History:  Diagnosis Date  . Anxiety   . Constipation   . Sinus congestion     Past Surgical History:  Procedure Laterality Date  . BREAST EXCISIONAL BIOPSY Bilateral 1976  . DILATION AND CURETTAGE OF UTERUS     x2  . LEEP N/A 06/14/2016   Procedure: LOOP ELECTROSURGICAL EXCISION PROCEDURE (LEEP);  Surgeon: Brayton Mars, MD;  Location: [facility] ORS;  Service: Gynecology;  Laterality: N/A;  . 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 SHOULDER ARTHROSCOPY W/ ROTATOR CUFF REPAIR Right     There were no vitals filed for this visit.      Subjective Assessment - 08/19/16 1808    Subjective Pt states she plans to begin yoga class at the beginning of October if her shoulder tolerates positioning/weight bearing stress.    Pertinent History Pt underwent s/p R RTC repair, subacromial decompression and biceps tendonitis (no indication of tenodesis) on 05/17/16. No information regarding how many tendons were repaired however MRI from before surgery reports full thickness anterior supraspinatus tear as well as partial articular surface anterior infraspinatus tear. She reports compliance with use of sling since surgery. She has not been icing. Has been able to disocontinue use of prescription pain medications. Pt reports compliance  with exercises since surgery that she was provided within limitations of protocol    Limitations House hold activities;Other (comment);Lifting   Diagnostic tests Before surgery: MRI reports full thickness anterior supraspinatus tear as well as partial articular surface anterior infraspinatus tear, mild subscapularis tendinosis, superior labral fraying with mild tra-articular long head bicept tendinosis. Small joint effusion, synovitis, and mild subacromial/subdeltoid bursitis   Patient Stated Goals Return to driving, work, and yoga   Currently in Pain? No/denies      Objective:  Therapeutic Exercise:  Therapeutic Exercise: SciFit 10 minutes Level 6 (warm up/no charge).  Standing shoulder flexion #2 x5, #1 2x15. Standing shoulder scaption #2 x5, #1 2x15. Standing shoulder abduction #2 x5, #1 2x15. Bicep curls #3 3x10. Wall push up 3x10. Modified "downward dog" pose in standing with pt at end range shoulder flexion. Nautilus: Standing lat. Pull downs 40#/ Shoulder extension 20#/ Shoulder adduction 30#/ Tricep extension 30#/ Chest press with wand 20#/ scap. Retraction 30# 20x each.  Pt response for medical necessity: Pt experiences >3/10 shoulder pain with 2# weighted scaption/abduction but is able to tolerate #1 with no increase in shoulder pain. She shows good tolerance to wall push up positioning/stress but has 3/10 shoulder pain with end range flexion with compression when performed in standing against wall to simulate downward dog pose.       PT Education - 08/19/16 1810    Education provided Yes   Education Details HEP: wall push up   [employer]) Educated Patient  Methods Explanation;Demonstration;Handout   Comprehension Verbalized understanding;Returned demonstration             PT Long Term Goals - 08/05/16 0839      PT LONG TERM GOAL #1   Title Pt will be independent with HEP in order to improve strength and ROM as well as decrease pain   Baseline Independent with HEP   Time 4    Period Weeks   Status Achieved     PT LONG TERM GOAL #2   Title Pt will decrease DASH by at least 8 points demonstrating clinically significant reduction in shoulder disability    Baseline QuickDASH: 34.1% on 8/15   Time 4   Period Weeks   Status Achieved     PT LONG TERM GOAL #3   Title Pt will report worst pain on NPRS as 1/10 in order to demonstrate significant reduction in R shoulder pain    Baseline >3/10 R shoulder pain at worst.    Time 4   Period Weeks   Status Partially Met     PT LONG TERM GOAL #4   Title Pt will increase R shoulder AROM so that it is symmetrical in all planes to LUE in order to perform ADLs such as washing/combing hair   Baseline Full R sh. AROM (pain limited at end-range)   Time 4   Period Weeks   Status Achieved     PT LONG TERM GOAL #5   Title Pt will increase R shoulder flexion, abduction, extension, IR, and ER strength to at least 4-/5 in order to improve function at home and work   Baseline ROM: R/L ER: 41/90, IR: 83/95. She experiences end range pain with IR/ER. Strength: MMT R/L shoulder flexion 4- (pain limited)/4+, abduction 4 (pain limited with compensatory shrug)/4+, biceps 5/5 (mild pain with R), tricep 5/5.    Time 4   Period Weeks   Status Partially Met     PT LONG TERM GOAL #6   Title Pt. will decrease QuickDASH to <20% to improve return to work without limitations.     Baseline QuickDASH: 34.1% on 8/15   Time 4   Period Weeks   Status On-going               Plan - 08/19/16 1811    Clinical Impression Statement Pt is unable to progress strengthening difficulty (from 1# to 2#) secondary to onset of R shoulder pain with weighted flexion, abduction and scaption. Pt able to tolerate closed chain position and wall push up but experiences increased shoulder pain with end range flexion and compression in standing to simulate downward dog pose/stress.   Rehab Potential Excellent   Clinical Impairments Affecting Rehab Potential  Positive: motivation, age, non-smoker; Negative: extent of repair   PT Frequency 2x / week   PT Duration 4 weeks   PT Treatment/Interventions ADLs/Self Care Home Management;Aquatic Therapy;Electrical Stimulation;Cryotherapy;Iontophoresis 34m/ml Dexamethasone;Moist Heat;Traction;Ultrasound;Gait training;Therapeutic activities;Therapeutic exercise;Neuromuscular re-education;Patient/family education;Manual techniques;Dry needling;Passive range of motion;Taping   PT Next Visit Plan Attempt Yoga positions and progress shoulder stability ex. Strengthen scapular plane.     PT Home Exercise Plan Seated scapular retractions, PROM R elbow extension stretch, pendulums for flexion and horizontal abduction/adduction, AAROM pulleys for flexion, submaximal isometrics for flexion, extension, IR, and ER per protocol.      Consulted and Agree with Plan of Care Patient      Patient will benefit from skilled therapeutic intervention in order to improve the following deficits and impairments:  Decreased range  of motion, Decreased scar mobility, Decreased strength, Increased muscle spasms, Pain, Hypomobility  Visit Diagnosis: Stiffness of right shoulder, not elsewhere classified  Pain in right shoulder  Muscle weakness (generalized)     Problem List Patient Active Problem List   Diagnosis Date Noted  . Status post LEEP (loop electrosurgical excision procedure) of cervix 06/23/2016  . Dysplasia of cervix, low grade (CIN 1) 04/29/2016  . 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 Constipation due to slow transit 02/23/2016  . Hyperlipidemia 06/23/2015  . 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 Anxiety and depression 05/26/15  . ADD (attention deficit disorder) May 26, 2015  . Allergic state 2015-05-26  . Family history of sudden death 2015-05-26  . Bloodgood disease 2015-05-26  . Chronic interstitial cystitis 26-May-2015  . Hypochromic microcytic anemia 26-May-2015  . Obstructive sleep apnea of adult  05-26-2015   Pura Spice, PT, DPT # [id number] Mickel Baas Charlotte Brafford SPT 08/19/2016, 6:14 PM  [facility] [address] [place], [place], [address] Phone: [phone]   Fax:  [phone]  Name: HEATH BADON MRN: [id number] Date of Birth: March 25, 1961

## 2016-08-24 ENCOUNTER — Ambulatory Visit: Payer: BC Managed Care – PPO | Admitting: Physical Therapy

## 2016-08-24 ENCOUNTER — Encounter: Payer: Self-pay | Admitting: Physical Therapy

## 2016-08-24 DIAGNOSIS — M25611 Stiffness of right shoulder, not elsewhere classified: Secondary | ICD-10-CM | POA: Diagnosis not present

## 2016-08-24 DIAGNOSIS — M25511 Pain in right shoulder: Secondary | ICD-10-CM

## 2016-08-24 DIAGNOSIS — M6281 Muscle weakness (generalized): Secondary | ICD-10-CM

## 2016-08-24 NOTE — Therapy (Signed)
[facility] [address] [place], [place], [address] Phone: [phone]   Fax:  [phone]  Physical Therapy Treatment  Patient Details  Name: Lauren Barrera MRN: [id number] Date of Birth: 1961-05-13 Referring Provider: Roselee Culver  Encounter Date: 08/24/2016      PT End of Session - 08/24/16 1813    Visit Number 19   Number of Visits 24   Date for PT Re-Evaluation 09/01/16   PT Start Time 1657   PT Stop Time 1746   PT Time Calculation (min) 49 min   Activity Tolerance Patient tolerated treatment well   Behavior During Therapy [facility] for tasks assessed/performed      Past Medical History:  Diagnosis Date  . Anxiety   . Constipation   . Sinus congestion     Past Surgical History:  Procedure Laterality Date  . BREAST EXCISIONAL BIOPSY Bilateral 1976  . DILATION AND CURETTAGE OF UTERUS     x2  . LEEP N/A 06/14/2016   Procedure: LOOP ELECTROSURGICAL EXCISION PROCEDURE (LEEP);  Surgeon: Brayton Mars, MD;  Location: [facility] ORS;  Service: Gynecology;  Laterality: N/A;  . 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 SHOULDER ARTHROSCOPY W/ ROTATOR CUFF REPAIR Right     There were no vitals filed for this visit.      Subjective Assessment - 08/24/16 1811    Subjective Pt states her shoulder is feeling good with no new c/o. She brought in several yoga exercises to review and perform in clinic. States that she would also consider pilates for core strengthening if her shoulder can handle it. Pt states [facility] see what kind of work out equipment she has available to her and report back to PT to discuss progression towards independent strengthening program.   Pertinent History Pt underwent s/p R RTC repair, subacromial decompression and biceps tendonitis (no indication of tenodesis) on 05/17/16. No information regarding how many tendons were repaired however MRI from before surgery  reports full thickness anterior supraspinatus tear as well as partial articular surface anterior infraspinatus tear. She reports compliance with use of sling since surgery. She has not been icing. Has been able to disocontinue use of prescription pain medications. Pt reports compliance with exercises since surgery that she was provided within limitations of protocol    Limitations House hold activities;Other (comment);Lifting   Diagnostic tests Before surgery: MRI reports full thickness anterior supraspinatus tear as well as partial articular surface anterior infraspinatus tear, mild subscapularis tendinosis, superior labral fraying with mild tra-articular long head bicept tendinosis. Small joint effusion, synovitis, and mild subacromial/subdeltoid bursitis   Patient Stated Goals Return to driving, work, and yoga   Currently in Pain? No/denies      Objective:  Therapeutic Exercise: UBE forwards/backwards 3 minutes ea (warm up/no charge). Rotator cuff "steering wheel" with red theraband with 5 sec holds 1 minute x3. Weighted shoulder flexion #2.5 x30; unable to tolerate #4 secondary to R shoulder pain. Scaption #1 x30. D1/D2 AROM x10 R shoulder; pt with more consistent performance performing AROM vs #1 weight. Horizontal shoulder abduction, diagonal/horizontal shoulder abduction x10 ea. Nautilus: shoulder ext #20 3x10, shoulder adduction #20 3x10. Performed yoga poses including modified shoulder stand, cat/camel, prone press up with no c/o of R shoulder pain.  Pt response for medical necessity: Pt with positive response response to yoga poses with no c/o of R shoulder pain. She continues to demonstrate good  tolerance of current exercise program intensity with c/o of mild pain only with increased intensity of exercise (increased weight/increased IR/ERROM).       PT Education - 08/24/16 1813    Education provided Yes   Education Details rotator cuff "steering wheel"    Person(s) Educated Patient    Methods Explanation;Demonstration;Handout   Comprehension Verbalized understanding;Returned demonstration           PT Long Term Goals - 08/05/16 0839      PT LONG TERM GOAL #1   Title Pt will be independent with HEP in order to improve strength and ROM as well as decrease pain   Baseline Independent with HEP   Time 4   Period Weeks   Status Achieved     PT LONG TERM GOAL #2   Title Pt will decrease DASH by at least 8 points demonstrating clinically significant reduction in shoulder disability    Baseline QuickDASH: 34.1% on 8/15   Time 4   Period Weeks   Status Achieved     PT LONG TERM GOAL #3   Title Pt will report worst pain on NPRS as 1/10 in order to demonstrate significant reduction in R shoulder pain    Baseline >3/10 R shoulder pain at worst.    Time 4   Period Weeks   Status Partially Met     PT LONG TERM GOAL #4   Title Pt will increase R shoulder AROM so that it is symmetrical in all planes to LUE in order to perform ADLs such as washing/combing hair   Baseline Full R sh. AROM (pain limited at end-range)   Time 4   Period Weeks   Status Achieved     PT LONG TERM GOAL #5   Title Pt will increase R shoulder flexion, abduction, extension, IR, and ER strength to at least 4-/5 in order to improve function at home and work   Baseline ROM: R/L ER: 41/90, IR: 83/95. She experiences end range pain with IR/ER. Strength: MMT R/L shoulder flexion 4- (pain limited)/4+, abduction 4 (pain limited with compensatory shrug)/4+, biceps 5/5 (mild pain with R), tricep 5/5.    Time 4   Period Weeks   Status Partially Met     PT LONG TERM GOAL #6   Title Pt. will decrease QuickDASH to <20% to improve return to work without limitations.     Baseline QuickDASH: 34.1% on 8/15   Time 4   Period Weeks   Status On-going            Plan - 08/24/16 1814    Clinical Impression Statement Pt continues to demonstrate difficulty with progress of weighted exercises with onset of R  shoulder with increase of weight from #1 to #2. She is able to perform D1/D2 patterns in AROM with no pain but mild end limitations in ER/IR but has more difficulty performing weighted exercise. Pt demonstrates good tolerance of yoga positions with no c/o of shoulder pain (performed modified shoulder stand, cobra, cat/camel, etc).   Rehab Potential Excellent   Clinical Impairments Affecting Rehab Potential Positive: motivation, age, non-smoker; Negative: extent of repair   PT Frequency 2x / week   PT Duration 4 weeks   PT Treatment/Interventions ADLs/Self Care Home Management;Aquatic Therapy;Electrical Stimulation;Cryotherapy;Iontophoresis 79m/ml Dexamethasone;Moist Heat;Traction;Ultrasound;Gait training;Therapeutic activities;Therapeutic exercise;Neuromuscular re-education;Patient/family education;Manual techniques;Dry needling;Passive range of motion;Taping   PT Next Visit Plan discuss recert vs discharge; continue to progress shoulder strengthening   PT Home Exercise Plan emphasis on R shoulder stability; include  shoulder AAROM with shoulder IR/ER    Consulted and Agree with Plan of Care Patient      Patient will benefit from skilled therapeutic intervention in order to improve the following deficits and impairments:  Decreased range of motion, Decreased scar mobility, Decreased strength, Increased muscle spasms, Pain, Hypomobility  Visit Diagnosis: Stiffness of right shoulder, not elsewhere classified  Pain in right shoulder  Muscle weakness (generalized)     Problem List Patient Active Problem List   Diagnosis Date Noted  . Status post LEEP (loop electrosurgical excision procedure) of cervix 06/23/2016  . Dysplasia of cervix, low grade (CIN 1) 04/29/2016  . 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 Constipation due to slow transit 02/23/2016  . Hyperlipidemia 06/23/2015  . 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 Anxiety and depression 2015-06-08  . ADD (attention deficit  disorder) 2015/06/08  . Allergic state 2015/06/08  . Family history of sudden death 06/08/15  . Bloodgood disease 08-Jun-2015  . Chronic interstitial cystitis 2015/06/08  . Hypochromic microcytic anemia 2015/06/08  . Obstructive sleep apnea of adult 06-08-2015   Pura Spice, PT, DPT # [id number] Mickel Baas Jaz Laningham SPT 08/24/2016, 6:17 PM  [facility] [address]. [place], [place], [address] Phone: [phone]   Fax:  [phone]  Name: ROSEANN KEES MRN: [id number] Date of Birth: 12-27-1960

## 2016-08-30 ENCOUNTER — Ambulatory Visit: Payer: BC Managed Care – PPO | Admitting: Physical Therapy

## 2016-08-30 ENCOUNTER — Encounter: Payer: Self-pay | Admitting: Physical Therapy

## 2016-08-30 DIAGNOSIS — M25611 Stiffness of right shoulder, not elsewhere classified: Secondary | ICD-10-CM

## 2016-08-30 DIAGNOSIS — M6281 Muscle weakness (generalized): Secondary | ICD-10-CM

## 2016-08-30 DIAGNOSIS — M25511 Pain in right shoulder: Secondary | ICD-10-CM

## 2016-08-30 NOTE — Therapy (Signed)
[facility] [address]. [place], [place], [address] Phone: [phone]   Fax:  [phone]  Physical Therapy Treatment/Discharge  Patient Details  Name: Lauren Barrera MRN: [id number] Date of Birth: 04-Jan-1961 Referring Provider: Roselee Culver  Encounter Date: 08/30/2016      PT End of Session - 08/30/16 1808    Visit Number 20   Number of Visits 24   Date for PT Re-Evaluation 09/01/16   PT Start Time 1640   PT Stop Time 1728   PT Time Calculation (min) 48 min   Activity Tolerance Patient tolerated treatment well   Behavior During Therapy [facility] [place] for tasks assessed/performed      Past Medical History:  Diagnosis Date  . Anxiety   . Constipation   . Sinus congestion     Past Surgical History:  Procedure Laterality Date  . BREAST EXCISIONAL BIOPSY Bilateral 1976  . DILATION AND CURETTAGE OF UTERUS     x2  . LEEP N/A 06/14/2016   Procedure: LOOP ELECTROSURGICAL EXCISION PROCEDURE (LEEP);  Surgeon: Brayton Mars, MD;  Location: [facility] ORS;  Service: Gynecology;  Laterality: N/A;  . 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 SHOULDER ARTHROSCOPY W/ ROTATOR CUFF REPAIR Right     There were no vitals filed for this visit.      Subjective Assessment - 08/30/16 1806    Subjective Pt feels that she is comfortable progressing towards independent strengthening program at this time. She starts yoga classes next week and her gym has appropriate equipment to continue R shoulder strengthening.   Pertinent History Pt underwent s/p R RTC repair, subacromial decompression and biceps tendonitis (no indication of tenodesis) on 05/17/16. No information regarding how many tendons were repaired however MRI from before surgery reports full thickness anterior supraspinatus tear as well as partial articular surface anterior infraspinatus tear. She reports compliance with use of sling since surgery. She has not been icing.  Has been able to disocontinue use of prescription pain medications. Pt reports compliance with exercises since surgery that she was provided within limitations of protocol    Limitations House hold activities;Other (comment);Lifting   Diagnostic tests Before surgery: MRI reports full thickness anterior supraspinatus tear as well as partial articular surface anterior infraspinatus tear, mild subscapularis tendinosis, superior labral fraying with mild tra-articular long head bicept tendinosis. Small joint effusion, synovitis, and mild subacromial/subdeltoid bursitis   Patient Stated Goals Return to driving, work, and yoga   Currently in Pain? No/denies      Objective:  Therapeutic Exercise: UBE f/b 3 minutes ea (warm up/no charge). Review/revise HEP program. Wall push up x10. Modified knee push up x10. Weighted shoulder flexion #2.5 x10. Scaption #1 x30. Horizontal shoulder abduction, diagonal/horizontal shoulder abduction x10 ea. Nautilus shoulder ext #20 x10, lat pull down #30 x10. Rotator cuff "steering wheel" with RTB. Shoulder IR with HBB x5. Bicep curls #3 x10.  Pt response for medical necessity: Pt with no c/o of shoulder pain during physical therapy session. She demonstrates good understanding of HEP program and is comfortable progressing strengthening program independently at this time.       PT Education - 08/30/16 1808    Education provided Yes   Education Details Updated HEP program   Person(s) Educated Patient   Methods Explanation;Demonstration;Handout   Comprehension Verbalized understanding;Returned demonstration             PT Long Term Goals - 08/30/16 1811  PT LONG TERM GOAL #1   Title Pt will be independent with HEP in order to improve strength and ROM as well as decrease pain   Baseline Independent with HEP   Time 4   Period Weeks   Status Achieved     PT LONG TERM GOAL #2   Title Pt will decrease DASH by at least 8 points demonstrating clinically  significant reduction in shoulder disability    Baseline QuickDASH: 34.1% on 8/15. 9/25: 9.1%   Time 4   Period Weeks   Status Achieved     PT LONG TERM GOAL #3   Title Pt will report worst pain on NPRS as 1/10 in order to demonstrate significant reduction in R shoulder pain    Baseline >3/10 R shoulder pain at worst.    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 AROM (pain limited at end-range)   Time 4   Period Weeks   Status Achieved     PT LONG TERM GOAL #5   Title Pt will increase R shoulder flexion, abduction, extension, IR, and ER strength to at least 4-/5 in order to improve function at home and work   Baseline ROM: R/L ER: 41/90, IR: 83/95. She experiences end range pain with IR/ER. Strength: MMT R/L shoulder flexion 4- (pain limited)/4+, abduction 4 (pain limited with compensatory shrug)/4+, biceps 5/5 (mild pain with R), tricep 5/5.    Time 4   Period Weeks   Status Partially Met     PT LONG TERM GOAL #6   Title Pt. will decrease QuickDASH to <20% to improve return to work without limitations.     Baseline QuickDASH: 34.1% on 8/15   Time 4   Period Weeks   Status Achieved               Plan - 08/30/16 1809    Clinical Impression Statement Pt demonstrates good understanding of home program exercises; she is able to perform all exercises with consistent form and is able to progress them as needed independently. Pt with equivalent functional IR but with movement pattern is more difficult to achieve with RUE. Pt will progress toward independent mangagement of sx/strengthening program at this time and follow up with PT if necessary.   Rehab Potential Excellent   Clinical Impairments Affecting Rehab Potential Positive: motivation, age, non-smoker; Negative: extent of repair   PT  Frequency 2x / week   PT Duration 4 weeks   PT Treatment/Interventions ADLs/Self Care Home Management;Aquatic Therapy;Electrical Stimulation;Cryotherapy;Iontophoresis 4mg/ml Dexamethasone;Moist Heat;Traction;Ultrasound;Gait training;Therapeutic activities;Therapeutic exercise;Neuromuscular re-education;Patient/family education;Manual techniques;Dry needling;Passive range of motion;Taping   PT Home Exercise Plan emphasis on R shoulder stability; include shoulder AAROM with shoulder IR/ER    Consulted and Agree with Plan of Care Patient      Patient will benefit from skilled therapeutic intervention in order to improve the following deficits and impairments:  Decreased range of motion, Decreased scar mobility, Decreased strength, Increased muscle spasms, Pain, Hypomobility  Visit Diagnosis: Stiffness of right shoulder, not elsewhere classified  Pain in right shoulder  Muscle weakness (generalized)     Problem List Patient Active Problem List   Diagnosis Date Noted  . Status post LEEP (loop electrosurgical excision procedure) of cervix 06/23/2016  .   Dysplasia of cervix, low grade (CIN 1) 04/29/2016  . 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 Constipation due to slow transit 02/23/2016  . Hyperlipidemia 06/23/2015  . 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 Anxiety and depression 05/15/2015  . ADD (attention deficit disorder) 05/15/2015  . Allergic state 05/15/2015  . Family history of sudden death 05/15/2015  . Bloodgood disease 05/15/2015  . Chronic interstitial cystitis 05/15/2015  . Hypochromic microcytic anemia 05/15/2015  . Obstructive sleep apnea of adult 05/15/2015   Michael C Sherk, PT, DPT # [id number] Laura Bissing SPT 08/30/2016, 6:12 PM  [facility] [address] [place], [place], [address] Phone: [phone]   Fax:  [phone]  Name: Sherae F Lineback MRN: [id number] Date of Birth: 07/05/1961    

## 2016-09-17 ENCOUNTER — Encounter (INDEPENDENT_AMBULATORY_CARE_PROVIDER_SITE_OTHER): Payer: BC Managed Care – PPO | Admitting: Ophthalmology

## 2016-09-17 DIAGNOSIS — H43813 Vitreous degeneration, bilateral: Secondary | ICD-10-CM | POA: Diagnosis not present

## 2016-09-17 DIAGNOSIS — H3561 Retinal hemorrhage, right eye: Secondary | ICD-10-CM

## 2016-11-12 ENCOUNTER — Encounter (INDEPENDENT_AMBULATORY_CARE_PROVIDER_SITE_OTHER): Payer: BC Managed Care – PPO | Admitting: Ophthalmology

## 2016-11-22 NOTE — Telephone Encounter (Signed)
error 

## 2016-11-24 ENCOUNTER — Encounter (INDEPENDENT_AMBULATORY_CARE_PROVIDER_SITE_OTHER): Payer: BC Managed Care – PPO | Admitting: Ophthalmology

## 2016-12-08 ENCOUNTER — Encounter (INDEPENDENT_AMBULATORY_CARE_PROVIDER_SITE_OTHER): Payer: BC Managed Care – PPO | Admitting: Ophthalmology

## 2016-12-22 ENCOUNTER — Encounter (INDEPENDENT_AMBULATORY_CARE_PROVIDER_SITE_OTHER): Payer: BC Managed Care – PPO | Admitting: Ophthalmology

## 2016-12-23 ENCOUNTER — Ambulatory Visit: Payer: BC Managed Care – PPO | Admitting: Obstetrics and Gynecology

## 2017-01-11 ENCOUNTER — Telehealth: Payer: Self-pay | Admitting: Internal Medicine

## 2017-01-11 ENCOUNTER — Other Ambulatory Visit: Payer: Self-pay | Admitting: Internal Medicine

## 2017-01-11 MED ORDER — FLUOXETINE HCL 40 MG PO CAPS: ORAL_CAPSULE | ORAL | 0 refills | Status: DC

## 2017-01-11 NOTE — Telephone Encounter (Signed)
Pt called need refill Rx. Fluoxetine send to [facility]--Lauren Barrera

## 2017-01-11 NOTE — Telephone Encounter (Signed)
Done

## 2017-01-12 ENCOUNTER — Encounter (INDEPENDENT_AMBULATORY_CARE_PROVIDER_SITE_OTHER): Payer: BC Managed Care – PPO | Admitting: Ophthalmology

## 2017-01-17 ENCOUNTER — Encounter (INDEPENDENT_AMBULATORY_CARE_PROVIDER_SITE_OTHER): Payer: BC Managed Care – PPO | Admitting: Ophthalmology

## 2017-01-17 DIAGNOSIS — H3561 Retinal hemorrhage, right eye: Secondary | ICD-10-CM | POA: Diagnosis not present

## 2017-01-17 DIAGNOSIS — H2513 Age-related nuclear cataract, bilateral: Secondary | ICD-10-CM | POA: Diagnosis not present

## 2017-01-17 DIAGNOSIS — H43813 Vitreous degeneration, bilateral: Secondary | ICD-10-CM

## 2017-04-12 ENCOUNTER — Other Ambulatory Visit: Payer: Self-pay | Admitting: Internal Medicine

## 2017-04-19 ENCOUNTER — Encounter: Payer: BC Managed Care – PPO | Admitting: Internal Medicine

## 2017-04-19 ENCOUNTER — Ambulatory Visit (INDEPENDENT_AMBULATORY_CARE_PROVIDER_SITE_OTHER): Payer: BC Managed Care – PPO | Admitting: Internal Medicine

## 2017-04-19 ENCOUNTER — Encounter: Payer: Self-pay | Admitting: Internal Medicine

## 2017-04-19 VITALS — BP 116/78 | HR 68 | Ht 65.0 in | Wt 161.8 lb

## 2017-04-19 DIAGNOSIS — N870 Mild cervical dysplasia: Secondary | ICD-10-CM

## 2017-04-19 DIAGNOSIS — F419 Anxiety disorder, unspecified: Secondary | ICD-10-CM | POA: Diagnosis not present

## 2017-04-19 DIAGNOSIS — K5901 Slow transit constipation: Secondary | ICD-10-CM | POA: Diagnosis not present

## 2017-04-19 DIAGNOSIS — F329 Major depressive disorder, single episode, unspecified: Secondary | ICD-10-CM

## 2017-04-19 DIAGNOSIS — Z0000 Encounter for general adult medical examination without abnormal findings: Secondary | ICD-10-CM | POA: Diagnosis not present

## 2017-04-19 DIAGNOSIS — Z1231 Encounter for screening mammogram for malignant neoplasm of breast: Secondary | ICD-10-CM

## 2017-04-19 DIAGNOSIS — E782 Mixed hyperlipidemia: Secondary | ICD-10-CM | POA: Diagnosis not present

## 2017-04-19 DIAGNOSIS — F32A Depression, unspecified: Secondary | ICD-10-CM

## 2017-04-19 DIAGNOSIS — Z1239 Encounter for other screening for malignant neoplasm of breast: Secondary | ICD-10-CM

## 2017-04-19 LAB — POCT URINALYSIS DIPSTICK
Bilirubin, UA: NEGATIVE
Blood, UA: NEGATIVE
GLUCOSE UA: NEGATIVE
Ketones, UA: NEGATIVE
Leukocytes, UA: NEGATIVE
NITRITE UA: NEGATIVE
Protein, UA: NEGATIVE
Spec Grav, UA: 1.01 (ref 1.010–1.025)
UROBILINOGEN UA: 0.2 U/dL
pH, UA: 6.5 (ref 5.0–8.0)

## 2017-04-19 NOTE — Progress Notes (Signed)
Date:  04/19/2017   Name:  Lauren Barrera   DOB:  Jul 11, 1961   MRN:  [id number]   Chief Complaint: Annual Exam (Breast Exam- Pap Smear. ) Lauren Barrera is a 56 y.o. female who presents today for her Complete Annual Exam. She feels well. She reports exercising yoga and walking. She reports she is sleeping well.   Abnormal Pap s/p LEEP - recommended Pap every 6 mo for 2 years.  Seen by Dr. Enzo Bi but has not had a pap in one year.  Constipation  Pertinent negatives include no abdominal pain, diarrhea, fever or vomiting. Treatments tried: medication.  Anxiety  Presents for follow-up visit. Patient reports no chest pain, dizziness, nervous/anxious behavior, palpitations or shortness of breath.      Review of Systems  Constitutional: Negative for chills, fatigue and fever.  HENT: Negative for congestion, hearing loss, tinnitus, trouble swallowing and voice change.   Eyes: Negative for visual disturbance.  Respiratory: Negative for cough, chest tightness, shortness of breath and wheezing.   Cardiovascular: Negative for chest pain, palpitations and leg swelling.  Gastrointestinal: Negative for abdominal pain, constipation, diarrhea and vomiting.  Endocrine: Negative for polydipsia and polyuria.  Genitourinary: Negative for dysuria, frequency, genital sores, vaginal bleeding and vaginal discharge.  Musculoskeletal: Negative for arthralgias, gait problem and joint swelling.  Skin: Negative for color change and rash.  Neurological: Negative for dizziness, tremors, light-headedness and headaches.  Hematological: Negative for adenopathy. Does not bruise/bleed easily.  Psychiatric/Behavioral: Negative for dysphoric mood and sleep disturbance. The patient is not nervous/anxious.     Patient Active Problem List   Diagnosis Date Noted  . Status post LEEP (loop electrosurgical excision procedure) of cervix 06/23/2016  . Dysplasia of cervix, low grade (CIN 1) 04/29/2016  . HSIL on Pap smear of  cervix 04/29/2016  . Constipation due to slow transit 02/23/2016  . Hyperlipidemia 06/23/2015  . 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 Anxiety and depression 09-Jun-2015  . ADD (attention deficit disorder) 09-Jun-2015  . Allergic state 2015-06-09  . Family history of sudden death 2015-06-09  . Bloodgood disease Jun 09, 2015  . Chronic interstitial cystitis 06-09-2015  . Hypochromic microcytic anemia 06/09/2015  . Obstructive sleep apnea of adult Jun 09, 2015    Prior to Admission medications   Medication Sig Start Date End Date Taking? Authorizing Provider  FLUoxetine (PROZAC) 40 MG capsule TAKE 2 CAPSULES BY MOUTH EVERY DAY 04/12/17   Glean Hess, MD       Glean Hess, MD  Multiple Vitamins-Minerals (MULTIVITAMIN WITH MINERALS) tablet Take 1 tablet by mouth daily.    [provider]    Allergies  Allergen Reactions  . Codeine Other (See Comments)    Other Reaction: causes prickly sensation    Past Surgical History:  Procedure Laterality Date  . BREAST EXCISIONAL BIOPSY Bilateral 1976  . DILATION AND CURETTAGE OF UTERUS     x2  . LEEP N/A 06/14/2016   Procedure: LOOP ELECTROSURGICAL EXCISION PROCEDURE (LEEP);  Surgeon: Brayton Mars, MD;  Location: [facility] ORS;  Service: Gynecology;  Laterality: N/A;  . MOHS SURGERY     Left Leg- behind knee   . 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 SHOULDER ARTHROSCOPY W/ ROTATOR CUFF REPAIR Right     Social History  Substance Use Topics  . Smoking status: Never Smoker  . Smokeless tobacco: Never Used  . Alcohol use 0.0 oz/week     Comment: occasional  Medication list has been reviewed and updated.   Physical Exam  Constitutional: She is oriented to person, place, and time. She appears well-developed and well-nourished. No distress.  HENT:  Head: Normocephalic and atraumatic.  Right Ear: Tympanic membrane and ear canal normal.  Left Ear: Tympanic membrane and ear canal normal.   Nose: Right sinus exhibits no maxillary sinus tenderness. Left sinus exhibits no maxillary sinus tenderness.  Mouth/Throat: Uvula is midline and oropharynx is clear and moist.  Eyes: Conjunctivae and EOM are normal. Right eye exhibits no discharge. Left eye exhibits no discharge. No scleral icterus.  Neck: Normal range of motion. Carotid bruit is not present. No erythema present. No thyromegaly present.  Cardiovascular: Normal rate, regular rhythm, normal heart sounds and normal pulses.   Pulmonary/Chest: Effort normal. No respiratory distress. She has no wheezes. Right breast exhibits no mass, no nipple discharge, no skin change and no tenderness. Left breast exhibits no mass, no nipple discharge, no skin change and no tenderness.  Abdominal: Soft. Bowel sounds are normal. There is no hepatosplenomegaly. There is no tenderness. There is no CVA tenderness.  Genitourinary: Vagina normal and uterus normal. There is no tenderness, lesion or injury on the right labia. There is no tenderness, lesion or injury on the left labia. Cervix exhibits no motion tenderness, no discharge and no friability. Right adnexum displays no mass, no tenderness and no fullness. Left adnexum displays no mass, no tenderness and no fullness.  Musculoskeletal: Normal range of motion.  Lymphadenopathy:    She has no cervical adenopathy.    She has no axillary adenopathy.  Neurological: She is alert and oriented to person, place, and time. She has normal reflexes. No cranial nerve deficit or sensory deficit.  Skin: Skin is warm, dry and intact. No rash noted.  Psychiatric: She has a normal mood and affect. Her speech is normal and behavior is normal. Thought content normal.  Nursing note and vitals reviewed.   BP 116/78 (BP Location: Right Arm, Patient Position: Sitting, Cuff Size: Normal)   Pulse 68   Ht 5\' 5"  (1.651 m)   Wt 161 lb 12.8 oz (73.4 kg)   SpO2 96%   BMI 26.92 kg/m   Assessment and Plan: 1. Annual  physical exam Normal exam - POCT urinalysis dipstick  2. Breast cancer screening - MM DIGITAL SCREENING BILATERAL; Future  3. Mixed hyperlipidemia Advise on medication - Comprehensive metabolic panel - Lipid panel  4. Anxiety and depression controlled - TSH  5. Constipation due to slow transit Doing well now on probiotics - CBC with Differential/Platelet  6. Dysplasia of cervix, low grade (CIN 1) Repeated to day and again in 6 mo - Pap IG (Image Guided)   No orders of the defined types were placed in this encounter.   Halina Maidens, MD [facility]  04/19/2017

## 2017-04-19 NOTE — Patient Instructions (Signed)
DASH Eating Plan DASH stands for "Dietary Approaches to Stop Hypertension." The DASH eating plan is a healthy eating plan that has been shown to reduce high blood pressure (hypertension). It may also reduce your risk for type 2 diabetes, heart disease, and stroke. The DASH eating plan may also help with weight loss. What are tips for following this plan? General guidelines  Avoid eating more than 2,300 mg (milligrams) of salt (sodium) a day. If you have hypertension, you may need to reduce your sodium intake to 1,500 mg a day.  Limit alcohol intake to no more than 1 drink a day for nonpregnant women and 2 drinks a day for men. One drink equals 12 oz of beer, 5 oz of wine, or 1 oz of hard liquor.  Work with your health care provider to maintain a healthy body weight or to lose weight. Ask what an ideal weight is for you.  Get at least 30 minutes of exercise that causes your heart to beat faster (aerobic exercise) most days of the week. Activities may include walking, swimming, or biking.  Work with your health care provider or diet and nutrition specialist (dietitian) to adjust your eating plan to your individual calorie needs. Reading food labels  Check food labels for the amount of sodium per serving. Choose foods with less than 5 percent of the Daily Value of sodium. Generally, foods with less than 300 mg of sodium per serving fit into this eating plan.  To find whole grains, look for the word "whole" as the first word in the ingredient list. Shopping  Buy products labeled as "low-sodium" or "no salt added."  Buy fresh foods. Avoid canned foods and premade or frozen meals. Cooking  Avoid adding salt when cooking. Use salt-free seasonings or herbs instead of table salt or sea salt. Check with your health care provider or pharmacist before using salt substitutes.  Do not fry foods. Cook foods using healthy methods such as baking, boiling, grilling, and broiling instead.  Cook with  heart-healthy oils, such as olive, canola, soybean, or sunflower oil. Meal planning   Eat a balanced diet that includes: ? 5 or more servings of fruits and vegetables each day. At each meal, try to fill half of your plate with fruits and vegetables. ? Up to 6-8 servings of whole grains each day. ? Less than 6 oz of lean meat, poultry, or fish each day. A 3-oz serving of meat is about the same size as a deck of cards. One egg equals 1 oz. ? 2 servings of low-fat dairy each day. ? A serving of nuts, seeds, or beans 5 times each week. ? Heart-healthy fats. Healthy fats called Omega-3 fatty acids are found in foods such as flaxseeds and coldwater fish, like sardines, salmon, and mackerel.  Limit how much you eat of the following: ? Canned or prepackaged foods. ? Food that is high in trans fat, such as fried foods. ? Food that is high in saturated fat, such as fatty meat. ? Sweets, desserts, sugary drinks, and other foods with added sugar. ? Full-fat dairy products.  Do not salt foods before eating.  Try to eat at least 2 vegetarian meals each week.  Eat more home-cooked food and less restaurant, buffet, and fast food.  When eating at a restaurant, ask that your food be prepared with less salt or no salt, if possible. What foods are recommended? The items listed may not be a complete list. Talk with your dietitian about what   dietary choices are best for you. Grains Whole-grain or whole-wheat bread. Whole-grain or whole-wheat pasta. Brown rice. Oatmeal. Quinoa. Bulgur. Whole-grain and low-sodium cereals. Pita bread. Low-fat, low-sodium crackers. Whole-wheat flour tortillas. Vegetables Fresh or frozen vegetables (raw, steamed, roasted, or grilled). Low-sodium or reduced-sodium tomato and vegetable juice. Low-sodium or reduced-sodium tomato sauce and tomato paste. Low-sodium or reduced-sodium canned vegetables. Fruits All fresh, dried, or frozen fruit. Canned fruit in natural juice (without  added sugar). Meat and other protein foods Skinless chicken or turkey. Ground chicken or turkey. Pork with fat trimmed off. Fish and seafood. Egg whites. Dried beans, peas, or lentils. Unsalted nuts, nut butters, and seeds. Unsalted canned beans. Lean cuts of beef with fat trimmed off. Low-sodium, lean deli meat. Dairy Low-fat (1%) or fat-free (skim) milk. Fat-free, low-fat, or reduced-fat cheeses. Nonfat, low-sodium ricotta or cottage cheese. Low-fat or nonfat yogurt. Low-fat, low-sodium cheese. Fats and oils Soft margarine without trans fats. Vegetable oil. Low-fat, reduced-fat, or light mayonnaise and salad dressings (reduced-sodium). Canola, safflower, olive, soybean, and sunflower oils. Avocado. Seasoning and other foods Herbs. Spices. Seasoning mixes without salt. Unsalted popcorn and pretzels. Fat-free sweets. What foods are not recommended? The items listed may not be a complete list. Talk with your dietitian about what dietary choices are best for you. Grains Baked goods made with fat, such as croissants, muffins, or some breads. Dry pasta or rice meal packs. Vegetables Creamed or fried vegetables. Vegetables in a cheese sauce. Regular canned vegetables (not low-sodium or reduced-sodium). Regular canned tomato sauce and paste (not low-sodium or reduced-sodium). Regular tomato and vegetable juice (not low-sodium or reduced-sodium). Pickles. Olives. Fruits Canned fruit in a light or heavy syrup. Fried fruit. Fruit in cream or butter sauce. Meat and other protein foods Fatty cuts of meat. Ribs. Fried meat. Bacon. Sausage. Bologna and other processed lunch meats. Salami. Fatback. Hotdogs. Bratwurst. Salted nuts and seeds. Canned beans with added salt. Canned or smoked fish. Whole eggs or egg yolks. Chicken or turkey with skin. Dairy Whole or 2% milk, cream, and half-and-half. Whole or full-fat cream cheese. Whole-fat or sweetened yogurt. Full-fat cheese. Nondairy creamers. Whipped toppings.  Processed cheese and cheese spreads. Fats and oils Butter. Stick margarine. Lard. Shortening. Ghee. Bacon fat. Tropical oils, such as coconut, palm kernel, or palm oil. Seasoning and other foods Salted popcorn and pretzels. Onion salt, garlic salt, seasoned salt, table salt, and sea salt. Worcestershire sauce. Tartar sauce. Barbecue sauce. Teriyaki sauce. Soy sauce, including reduced-sodium. Steak sauce. Canned and packaged gravies. Fish sauce. Oyster sauce. Cocktail sauce. Horseradish that you find on the shelf. Ketchup. Mustard. Meat flavorings and tenderizers. Bouillon cubes. Hot sauce and Tabasco sauce. Premade or packaged marinades. Premade or packaged taco seasonings. Relishes. Regular salad dressings. Where to find more information:  National Heart, Lung, and Blood Institute: www.nhlbi.nih.gov  American Heart Association: www.heart.org Summary  The DASH eating plan is a healthy eating plan that has been shown to reduce high blood pressure (hypertension). It may also reduce your risk for type 2 diabetes, heart disease, and stroke.  With the DASH eating plan, you should limit salt (sodium) intake to 2,300 mg a day. If you have hypertension, you may need to reduce your sodium intake to 1,500 mg a day.  When on the DASH eating plan, aim to eat more fresh fruits and vegetables, whole grains, lean proteins, low-fat dairy, and heart-healthy fats.  Work with your health care provider or diet and nutrition specialist (dietitian) to adjust your eating plan to your individual   calorie needs. This information is not intended to replace advice given to you by your health care provider. Make sure you discuss any questions you have with your health care provider. Document Released: 11/11/2011 Document Revised: 11/15/2016 Document Reviewed: 11/15/2016 Elsevier Interactive Patient Education  2017 Elsevier Inc.  

## 2017-04-20 LAB — COMPREHENSIVE METABOLIC PANEL
ALBUMIN: 4.1 g/dL (ref 3.5–5.5)
ALT: 22 IU/L (ref 0–32)
AST: 18 IU/L (ref 0–40)
Albumin/Globulin Ratio: 1.6 (ref 1.2–2.2)
Alkaline Phosphatase: 106 IU/L (ref 39–117)
BILIRUBIN TOTAL: 0.5 mg/dL (ref 0.0–1.2)
BUN / CREAT RATIO: 23 (ref 9–23)
BUN: 14 mg/dL (ref 6–24)
CHLORIDE: 99 mmol/L (ref 96–106)
CO2: 23 mmol/L (ref 18–29)
Calcium: 9.2 mg/dL (ref 8.7–10.2)
Creatinine, Ser: 0.6 mg/dL (ref 0.57–1.00)
GFR calc non Af Amer: 102 mL/min/{1.73_m2} (ref >59)
GFR, EST AFRICAN AMERICAN: 117 mL/min/{1.73_m2} (ref >59)
GLOBULIN, TOTAL: 2.5 g/dL (ref 1.5–4.5)
GLUCOSE: 108 mg/dL — AB (ref 65–99)
Potassium: 4.8 mmol/L (ref 3.5–5.2)
Sodium: 139 mmol/L (ref 134–144)
TOTAL PROTEIN: 6.6 g/dL (ref 6.0–8.5)

## 2017-04-20 LAB — CBC WITH DIFFERENTIAL/PLATELET
BASOS ABS: 0 10*3/uL (ref 0.0–0.2)
BASOS: 1 %
EOS (ABSOLUTE): 0.1 10*3/uL (ref 0.0–0.4)
EOS: 1 %
HEMATOCRIT: 41.3 % (ref 34.0–46.6)
HEMOGLOBIN: 13.8 g/dL (ref 11.1–15.9)
IMMATURE GRANS (ABS): 0 10*3/uL (ref 0.0–0.1)
Immature Granulocytes: 0 %
LYMPHS: 26 %
Lymphocytes Absolute: 1.5 10*3/uL (ref 0.7–3.1)
MCH: 30.7 pg (ref 26.6–33.0)
MCHC: 33.4 g/dL (ref 31.5–35.7)
MCV: 92 fL (ref 79–97)
MONOCYTES: 9 %
Monocytes Absolute: 0.5 10*3/uL (ref 0.1–0.9)
NEUTROS ABS: 3.7 10*3/uL (ref 1.4–7.0)
Neutrophils: 63 %
Platelets: 237 10*3/uL (ref 150–379)
RBC: 4.5 x10E6/uL (ref 3.77–5.28)
RDW: 13.3 % (ref 12.3–15.4)
WBC: 5.8 10*3/uL (ref 3.4–10.8)

## 2017-04-20 LAB — LIPID PANEL
Chol/HDL Ratio: 3.9 ratio (ref 0.0–4.4)
Cholesterol, Total: 230 mg/dL — ABNORMAL HIGH (ref 100–199)
HDL: 59 mg/dL (ref >39)
LDL CALC: 143 mg/dL — AB (ref 0–99)
Triglycerides: 142 mg/dL (ref 0–149)
VLDL CHOLESTEROL CAL: 28 mg/dL (ref 5–40)

## 2017-04-20 LAB — TSH: TSH: 4.64 u[IU]/mL — AB (ref 0.450–4.500)

## 2017-04-22 LAB — PAP IG (IMAGE GUIDED): PAP Smear Comment: 0

## 2017-06-21 ENCOUNTER — Ambulatory Visit (INDEPENDENT_AMBULATORY_CARE_PROVIDER_SITE_OTHER): Payer: BC Managed Care – PPO | Admitting: Internal Medicine

## 2017-06-21 ENCOUNTER — Encounter: Payer: Self-pay | Admitting: Internal Medicine

## 2017-06-21 VITALS — BP 132/78 | HR 68 | Ht 65.0 in | Wt 167.0 lb

## 2017-06-21 DIAGNOSIS — M79672 Pain in left foot: Secondary | ICD-10-CM | POA: Diagnosis not present

## 2017-06-21 NOTE — Patient Instructions (Signed)
Dr. Samara Deist [facility] clinic but comes to [facility]  Dr. Loralyn Freshwater - in [place] on [address]  Dr. Elta Guadeloupe Pifer - in [place] on [address]

## 2017-06-21 NOTE — Progress Notes (Signed)
Date:  06/21/2017   Name:  Lauren Barrera   DOB:  11-18-1961   MRN:  [id number]   Chief Complaint: Foot Pain (Left foot is starting to curve out on the outside of foot. Starting to be painful to wear certain shoes. - getting worse over the last several months. ) Foot Pain  This is a new problem. The current episode started more than 1 month ago. The problem occurs constantly. The problem has been gradually worsening. Associated symptoms include arthralgias. Pertinent negatives include no chest pain, chills or fatigue.      Review of Systems  Constitutional: Negative for chills and fatigue.  Respiratory: Negative for chest tightness and shortness of breath.   Cardiovascular: Negative for chest pain, palpitations and leg swelling.  Musculoskeletal: Positive for arthralgias and gait problem.    Patient Active Problem List   Diagnosis Date Noted  . Status post LEEP (loop electrosurgical excision procedure) of cervix 06/23/2016  . Dysplasia of cervix, low grade (CIN 1) 04/29/2016  . 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 Constipation due to slow transit 02/23/2016  . Hyperlipidemia 06/23/2015  . 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 Anxiety and depression 2015-05-24  . ADD (attention deficit disorder) 2015/05/24  . Allergic state May 24, 2015  . Family history of sudden death 05-24-15  . Bloodgood disease 05-24-2015  . Chronic interstitial cystitis 2015/05/24  . Hypochromic microcytic anemia May 24, 2015  . Obstructive sleep apnea of adult 05/24/2015    Prior to Admission medications   Medication Sig Start Date End Date Taking? Authorizing Provider  Calcium-Magnesium-Vitamin D [id number] MG-MG-UNIT TB24 Take by mouth.   Yes [provider]  FLUoxetine (PROZAC) 40 MG capsule TAKE 2 CAPSULES BY MOUTH EVERY DAY 04/12/17  Yes Glean Hess, MD  Multiple Vitamins-Minerals (MULTIVITAMIN WITH MINERALS) tablet Take 1 tablet by mouth daily.   Yes [provider]    Omega-3 350 MG CPDR Take by mouth.   Yes [provider]    Allergies  Allergen Reactions  . Codeine Other (See Comments)    Other Reaction: causes prickly sensation    Past Surgical History:  Procedure Laterality Date  . BREAST EXCISIONAL BIOPSY Bilateral 1976  . DILATION AND CURETTAGE OF UTERUS     x2  . LEEP N/A 06/14/2016   Procedure: LOOP ELECTROSURGICAL EXCISION PROCEDURE (LEEP);  Surgeon: Brayton Mars, MD;  Location: [facility] ORS;  Service: Gynecology;  Laterality: N/A;  . MOHS SURGERY     Left Leg- behind knee   . 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 SHOULDER ARTHROSCOPY W/ ROTATOR CUFF REPAIR Right     Social History  Substance Use Topics  . Smoking status: Never Smoker  . Smokeless tobacco: Never Used  . Alcohol use 0.0 oz/week     Comment: occasional     Medication list has been reviewed and updated.   Physical Exam  Constitutional: She is oriented to person, place, and time. She appears well-developed. No distress.  HENT:  Head: Normocephalic and atraumatic.  Cardiovascular: Normal rate, regular rhythm and normal heart sounds.   Pulmonary/Chest: Effort normal and breath sounds normal. No respiratory distress. She has no wheezes.  Musculoskeletal: Normal range of motion.       Feet:  Neurological: She is alert and oriented to person, place, and time.  Skin: Skin is warm and dry. No rash noted.  Psychiatric: She has a normal mood and affect. Her behavior is normal.  Thought content normal.  Nursing note and vitals reviewed.   BP 132/78   Pulse 68   Ht 5\' 5"  (1.651 m)   Wt 167 lb (75.8 kg)   SpO2 97%   BMI 27.79 kg/m   Assessment and Plan: 1. Foot pain, left Recommend Podiatry evaluation   No orders of the defined types were placed in this encounter.   Halina Maidens, MD [facility]  06/21/2017

## 2017-07-18 ENCOUNTER — Ambulatory Visit
Admission: RE | Admit: 2017-07-18 | Discharge: 2017-07-18 | Disposition: A | Discharge status: Home / Self Care | Payer: BC Managed Care – PPO | Source: Ambulatory Visit | Attending: Internal Medicine | Admitting: Internal Medicine

## 2017-07-18 DIAGNOSIS — Z1231 Encounter for screening mammogram for malignant neoplasm of breast: Secondary | ICD-10-CM | POA: Diagnosis present

## 2017-07-18 DIAGNOSIS — Z1239 Encounter for other screening for malignant neoplasm of breast: Secondary | ICD-10-CM

## 2017-10-20 ENCOUNTER — Ambulatory Visit (INDEPENDENT_AMBULATORY_CARE_PROVIDER_SITE_OTHER): Payer: BC Managed Care – PPO | Admitting: Internal Medicine

## 2017-10-20 ENCOUNTER — Encounter: Payer: Self-pay | Admitting: Internal Medicine

## 2017-10-20 VITALS — BP 104/68 | HR 68 | Ht 65.0 in | Wt 167.0 lb

## 2017-10-20 DIAGNOSIS — N870 Mild cervical dysplasia: Secondary | ICD-10-CM

## 2017-10-20 NOTE — Progress Notes (Signed)
Date:  10/20/2017   Name:  Lauren Barrera   DOB:  08-01-1961   MRN:  [id number]   Chief Complaint: Abnormal Pap Smear Here for 6 month pap repeat as requested by GYN following LEEP in 06/2016. Last pap was normal 6 months ago. She is doing well with no problems.   Review of Systems  Constitutional: Negative for chills, fatigue and fever.  Respiratory: Negative for chest tightness, shortness of breath and wheezing.   Cardiovascular: Negative for chest pain and palpitations.  Genitourinary: Negative for dyspareunia, genital sores, menstrual problem, vaginal bleeding, vaginal discharge and vaginal pain.    Patient Active Problem List   Diagnosis Date Noted  . Status post LEEP (loop electrosurgical excision procedure) of cervix 06/23/2016  . Dysplasia of cervix, low grade (CIN 1) 04/29/2016  . 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 Constipation due to slow transit 02/23/2016  . Hyperlipidemia 06/23/2015  . 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 Anxiety and depression 06-02-15  . ADD (attention deficit disorder) 06/02/15  . Allergic state 06/02/2015  . Family history of sudden death 02-Jun-2015  . Bloodgood disease 2015-06-02  . Chronic interstitial cystitis 2015/06/02  . Hypochromic microcytic anemia June 02, 2015  . Obstructive sleep apnea of adult 06-02-2015    Prior to Admission medications   Medication Sig Start Date End Date Taking? Authorizing Provider  Calcium-Magnesium-Vitamin D [phone] MG-MG-UNIT TB24 Take by mouth.    [provider]  FLUoxetine (PROZAC) 40 MG capsule TAKE 2 CAPSULES BY MOUTH EVERY DAY 04/12/17   Glean Hess, MD  Multiple Vitamins-Minerals (MULTIVITAMIN WITH MINERALS) tablet Take 1 tablet by mouth daily.    [provider]  Omega-3 350 MG CPDR Take by mouth.    [provider]    Allergies  Allergen Reactions  . Codeine Other (See Comments)    Other Reaction: causes prickly sensation    Past Surgical History:   Procedure Laterality Date  . BREAST EXCISIONAL BIOPSY Bilateral 1976  . DILATION AND CURETTAGE OF UTERUS     x2  . LEEP N/A 06/14/2016   Procedure: LOOP ELECTROSURGICAL EXCISION PROCEDURE (LEEP);  Surgeon: Brayton Mars, MD;  Location: [facility] ORS;  Service: Gynecology;  Laterality: N/A;  . MOHS SURGERY     Left Leg- behind knee   . 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 SHOULDER ARTHROSCOPY W/ ROTATOR CUFF REPAIR Right     Social History   Tobacco Use  . Smoking status: Never Smoker  . Smokeless tobacco: Never Used  Substance Use Topics  . Alcohol use: Yes    Alcohol/week: 0.0 oz    Comment: occasional  . Drug use: No     Medication list has been reviewed and updated.  PHQ 2/9 Scores 10/20/2017 04/19/2017 02/04/2016  PHQ - 2 Score 0 0 0  PHQ- 9 Score 0 - -    Physical Exam  Constitutional: She is oriented to person, place, and time. She appears well-developed. No distress.  HENT:  Head: Normocephalic and atraumatic.  Pulmonary/Chest: Effort normal. No respiratory distress.  Abdominal: Soft. Bowel sounds are normal.  Genitourinary: Uterus normal. There is no tenderness, lesion or injury on the right labia. There is no tenderness, lesion or injury on the left labia. Cervix exhibits no motion tenderness, no discharge and no friability. Right adnexum displays no mass, no tenderness and no fullness. Left adnexum displays no mass, no tenderness and no fullness. No erythema, tenderness or  bleeding in the vagina. No foreign body in the vagina. No signs of injury around the vagina. No vaginal discharge found.  Musculoskeletal: Normal range of motion.  Neurological: She is alert and oriented to person, place, and time.  Skin: Skin is warm and dry. No rash noted.  Psychiatric: She has a normal mood and affect. Her behavior is normal. Thought content normal.  Nursing note and vitals reviewed.   BP 104/68   Pulse 68   Ht 5\' 5"  (1.651 m)   Wt 167 lb  (75.8 kg)   BMI 27.79 kg/m   Assessment and Plan: 1. Dysplasia of cervix, low grade (CIN 1) Normal exam - repeat in 6 months - Pap IG (Image Guided)   No orders of the defined types were placed in this encounter.   Partially dictated using Editor, commissioning. Any errors are unintentional.  Halina Maidens, MD [facility]  10/20/2017

## 2017-10-25 LAB — PAP IG (IMAGE GUIDED): PAP SMEAR COMMENT: 0

## 2017-12-22 ENCOUNTER — Encounter: Payer: Self-pay | Admitting: Internal Medicine

## 2017-12-22 ENCOUNTER — Ambulatory Visit: Payer: BC Managed Care – PPO | Admitting: Internal Medicine

## 2017-12-22 VITALS — BP 122/84 | HR 76 | Ht 65.0 in | Wt 169.0 lb

## 2017-12-22 DIAGNOSIS — F419 Anxiety disorder, unspecified: Secondary | ICD-10-CM | POA: Diagnosis not present

## 2017-12-22 DIAGNOSIS — F329 Major depressive disorder, single episode, unspecified: Secondary | ICD-10-CM

## 2017-12-22 MED ORDER — CLONAZEPAM 0.5 MG PO TABS: 0.2500 mg | ORAL_TABLET | Freq: Two times a day (BID) | ORAL | 0 refills | Status: DC | PRN

## 2017-12-22 NOTE — Progress Notes (Signed)
Date:  12/22/2017   Name:  Lauren Barrera   DOB:  Oct 11, 1961   MRN:  [id number]   Chief Complaint: Referral (Wants referral to psychiatrist or counceler. ) Depression         This is a chronic problem.  The problem has been gradually worsening since onset.  Associated symptoms include decreased concentration, irritable and sad.  Associated symptoms include no fatigue, does not have insomnia and no suicidal ideas.     The symptoms are aggravated by work stress.  Past treatments include SSRIs - Selective serotonin reuptake inhibitors.  Compliance with treatment is good. She has been on Prozac for years but lately doing poorly.  She feels that she needs to seek Psychiatric care.    Review of Systems  Constitutional: Negative for chills, fatigue and unexpected weight change.  Respiratory: Negative for chest tightness and shortness of breath.   Cardiovascular: Negative for chest pain and palpitations.  Psychiatric/Behavioral: Positive for agitation, decreased concentration and depression. Negative for sleep disturbance and suicidal ideas. The patient is hyperactive. The patient does not have insomnia.     Patient Active Problem List   Diagnosis Date Noted  . Status post LEEP (loop electrosurgical excision procedure) of cervix 06/23/2016  . Dysplasia of cervix, low grade (CIN 1) 04/29/2016  . 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 Constipation due to slow transit 02/23/2016  . Hyperlipidemia 06/23/2015  . 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 Anxiety and depression 2015/05/19  . ADD (attention deficit disorder) 05-19-2015  . Allergic state 05-19-15  . Family history of sudden death 05-19-2015  . Bloodgood disease May 19, 2015  . Chronic interstitial cystitis May 19, 2015  . Hypochromic microcytic anemia 05/19/2015  . Obstructive sleep apnea of adult 05-19-15    Prior to Admission medications   Medication Sig Start Date End Date Taking? Authorizing Provider    Calcium-Magnesium-Vitamin D [phone] MG-MG-UNIT TB24 Take by mouth.   Yes [provider]  FLUoxetine (PROZAC) 40 MG capsule TAKE 2 CAPSULES BY MOUTH EVERY DAY 04/12/17  Yes Glean Hess, MD  Multiple Vitamins-Minerals (MULTIVITAMIN WITH MINERALS) tablet Take 1 tablet by mouth daily.   Yes [provider]  Omega-3 350 MG CPDR Take by mouth.   Yes [provider]  Probiotic Product (PROBIOTIC-10 ULTIMATE PO) Take by mouth.   Yes [provider]    Allergies  Allergen Reactions  . Codeine Other (See Comments)    Other Reaction: causes prickly sensation    Past Surgical History:  Procedure Laterality Date  . BREAST EXCISIONAL BIOPSY Bilateral 1976  . DILATION AND CURETTAGE OF UTERUS     x2  . LEEP N/A 06/14/2016   Procedure: LOOP ELECTROSURGICAL EXCISION PROCEDURE (LEEP);  Surgeon: Brayton Mars, MD;  Location: [facility] ORS;  Service: Gynecology;  Laterality: N/A;  . MOHS SURGERY     Left Leg- behind knee   . 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 SHOULDER ARTHROSCOPY W/ ROTATOR CUFF REPAIR Right     Social History   Tobacco Use  . Smoking status: Never Smoker  . Smokeless tobacco: Never Used  Substance Use Topics  . Alcohol use: Yes    Alcohol/week: 0.0 oz    Comment: occasional  . Drug use: No     Medication list has been reviewed and updated.  PHQ 2/9 Scores 10/20/2017 04/19/2017 02/04/2016  PHQ - 2 Score 0 0 0  PHQ- 9 Score 0 - -  Physical Exam  Constitutional: She is oriented to person, place, and time. She appears well-developed. She is irritable. No distress.  HENT:  Head: Normocephalic and atraumatic.  Pulmonary/Chest: Effort normal. No respiratory distress.  Musculoskeletal: Normal range of motion.  Neurological: She is alert and oriented to person, place, and time.  Skin: Skin is warm and dry. No rash noted.  Psychiatric: Her behavior is normal. Thought content normal. Her mood appears  anxious. Her affect is not angry. Her speech is rapid and/or pressured. She does not exhibit a depressed mood.  Nursing note and vitals reviewed.   BP 122/84   Pulse 76   Ht 5\' 5"  (1.651 m)   Wt 169 lb (76.7 kg)   SpO2 98%   BMI 28.12 kg/m   Assessment and Plan: 1. Anxiety and depression Continue current medication Add low dose clonzepam prn for acute symptoms - Ambulatory referral to Psychiatry   Meds ordered this encounter  Medications  . clonazePAM (KLONOPIN) 0.5 MG tablet    Sig: Take 0.5 tablets (0.25 mg total) by mouth 2 (two) times daily as needed for anxiety.    Dispense:  20 tablet    Refill:  0    Partially dictated using Editor, commissioning. Any errors are unintentional.  Halina Maidens, MD [facility]  12/22/2017

## 2018-01-25 ENCOUNTER — Other Ambulatory Visit: Payer: Self-pay

## 2018-01-25 MED ORDER — FLUOXETINE HCL 40 MG PO CAPS: 80.0000 mg | ORAL_CAPSULE | Freq: Every day | ORAL | 3 refills | Status: DC

## 2018-03-28 ENCOUNTER — Encounter: Payer: Self-pay | Admitting: Internal Medicine

## 2018-03-28 ENCOUNTER — Ambulatory Visit (INDEPENDENT_AMBULATORY_CARE_PROVIDER_SITE_OTHER): Payer: BC Managed Care – PPO | Admitting: Internal Medicine

## 2018-03-28 VITALS — BP 140/92 | HR 77 | Temp 97.7°F | Ht 65.0 in | Wt 167.0 lb

## 2018-03-28 DIAGNOSIS — R197 Diarrhea, unspecified: Secondary | ICD-10-CM | POA: Diagnosis not present

## 2018-03-28 NOTE — Progress Notes (Signed)
Date:  03/28/2018   Name:  Lauren Barrera   DOB:  May 31, 1961   MRN:  [id number]   Chief Complaint: Diarrhea (Started last wednesday. Tried pepto bismol. Then tried immodium. Last took yesterday.  Pain in stomach and burping along with gassy stomach. )  Diarrhea   This is a new problem. The current episode started in the past 7 days. The problem occurs 5 to 10 times per day. The problem has been gradually improving. The stool consistency is described as watery. Associated symptoms include bloating and increased flatus. Pertinent negatives include no abdominal pain, chills, coughing, fever, headaches, sweats, vomiting or weight loss. There are no known risk factors (although sx did start after several days at the [place] where there were some water issues). She has tried bismuth subsalicylate and anti-motility drug for the symptoms. The treatment provided mild relief. Her past medical history is significant for irritable bowel syndrome (has hx of life long constipation/diarrhea).     Review of Systems  Constitutional: Positive for fatigue. Negative for chills, fever, unexpected weight change and weight loss.  Respiratory: Negative for cough, chest tightness and shortness of breath.   Cardiovascular: Negative for chest pain and palpitations.  Gastrointestinal: Positive for abdominal distention, bloating, diarrhea and flatus. Negative for abdominal pain, nausea and vomiting.  Neurological: Negative for dizziness and headaches.  Psychiatric/Behavioral: Negative for sleep disturbance.    Patient Active Problem List   Diagnosis Date Noted  . Status post LEEP (loop electrosurgical excision procedure) of cervix 06/23/2016  . Dysplasia of cervix, low grade (CIN 1) 04/29/2016  . 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 Constipation due to slow transit 02/23/2016  . Hyperlipidemia 06/23/2015  . 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 Anxiety and depression 06/06/15  . ADD (attention deficit  disorder) 06-06-15  . Allergic state 2015/06/06  . Family history of sudden death 06-06-15  . Bloodgood disease 06-06-15  . Chronic interstitial cystitis June 06, 2015  . Hypochromic microcytic anemia Jun 06, 2015  . Obstructive sleep apnea of adult 2015-06-06    Prior to Admission medications   Medication Sig Start Date End Date Taking? Authorizing Provider  Calcium-Magnesium-Vitamin D [id number] MG-MG-UNIT TB24 Take by mouth.   Yes [provider]  citalopram (CELEXA) 40 MG tablet Take 40 mg by mouth daily.   Yes [provider]  DULoxetine (CYMBALTA) 60 MG capsule Take 60 mg by mouth daily.   Yes [provider]  magnesium gluconate (MAGONATE) 500 MG tablet Take 500 mg by mouth 2 (two) times daily.   Yes [provider]  Multiple Vitamins-Minerals (MULTIVITAMIN WITH MINERALS) tablet Take 1 tablet by mouth daily.   Yes [provider]  Probiotic Product (PROBIOTIC-10 ULTIMATE PO) Take by mouth.   Yes [provider]    Allergies  Allergen Reactions  . Codeine Other (See Comments)    Other Reaction: causes prickly sensation    Past Surgical History:  Procedure Laterality Date  . BREAST EXCISIONAL BIOPSY Bilateral 1976  . DILATION AND CURETTAGE OF UTERUS     x2  . LEEP N/A 06/14/2016   Procedure: LOOP ELECTROSURGICAL EXCISION PROCEDURE (LEEP);  Surgeon: Brayton Mars, MD;  Location: [facility] ORS;  Service: Gynecology;  Laterality: N/A;  . MOHS SURGERY     Left Leg- behind knee   . 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 SHOULDER ARTHROSCOPY W/ ROTATOR CUFF REPAIR Right     Social History   Tobacco Use  .  Smoking status: Never Smoker  . Smokeless tobacco: Never Used  Substance Use Topics  . Alcohol use: Yes    Alcohol/week: 0.0 oz    Comment: occasional  . Drug use: No     Medication list has been reviewed and updated.  PHQ 2/9 Scores 03/28/2018 10/20/2017 04/19/2017 02/04/2016  PHQ - 2  Score 0 0 0 0  PHQ- 9 Score 0 0 - -    Physical Exam  Constitutional: She is oriented to person, place, and time. She appears well-developed. No distress.  HENT:  Head: Normocephalic and atraumatic.  Neck: Normal range of motion. Neck supple.  Cardiovascular: Normal rate, regular rhythm and normal heart sounds.  Pulmonary/Chest: Effort normal and breath sounds normal. No respiratory distress.  Abdominal: Soft. Normal appearance and bowel sounds are normal. There is no hepatosplenomegaly. There is tenderness in the suprapubic area. There is no rigidity, no guarding and no CVA tenderness.  Musculoskeletal: Normal range of motion.  Neurological: She is alert and oriented to person, place, and time.  Skin: Skin is warm and dry. No rash noted.  Psychiatric: She has a normal mood and affect. Her behavior is normal. Thought content normal.    BP (!) 140/92   Pulse 77   Temp 97.7 F (36.5 C) (Oral)   Ht 5\' 5"  (1.651 m)   Wt 167 lb (75.8 kg)   SpO2 96%   BMI 27.79 kg/m   Assessment and Plan: 1. Diarrhea, unspecified type DDX: giardia, viral enteritis, flare of IBS-D, diverticulitis Will check labs and since pt is not acutely ill will continue imodium and fluids for now Consider empiric Flagyl if persistent - CBC with Differential/Platelet - Comprehensive metabolic panel   No orders of the defined types were placed in this encounter.   Partially dictated using Editor, commissioning. Any errors are unintentional.  Halina Maidens, MD [facility]  03/28/2018

## 2018-03-29 ENCOUNTER — Other Ambulatory Visit: Payer: Self-pay | Admitting: Internal Medicine

## 2018-03-29 DIAGNOSIS — R197 Diarrhea, unspecified: Secondary | ICD-10-CM

## 2018-03-29 LAB — CBC WITH DIFFERENTIAL/PLATELET
Basophils Absolute: 0 10*3/uL (ref 0.0–0.2)
Basos: 0 %
EOS (ABSOLUTE): 0.1 10*3/uL (ref 0.0–0.4)
Eos: 1 %
HEMOGLOBIN: 14.1 g/dL (ref 11.1–15.9)
Hematocrit: 41.9 % (ref 34.0–46.6)
IMMATURE GRANS (ABS): 0 10*3/uL (ref 0.0–0.1)
Immature Granulocytes: 0 %
LYMPHS ABS: 2 10*3/uL (ref 0.7–3.1)
Lymphs: 29 %
MCH: 31.1 pg (ref 26.6–33.0)
MCHC: 33.7 g/dL (ref 31.5–35.7)
MCV: 93 fL (ref 79–97)
MONOCYTES: 10 %
Monocytes Absolute: 0.7 10*3/uL (ref 0.1–0.9)
Neutrophils Absolute: 4.1 10*3/uL (ref 1.4–7.0)
Neutrophils: 60 %
Platelets: 241 10*3/uL (ref 150–379)
RBC: 4.53 x10E6/uL (ref 3.77–5.28)
RDW: 13.6 % (ref 12.3–15.4)
WBC: 6.9 10*3/uL (ref 3.4–10.8)

## 2018-03-29 LAB — COMPREHENSIVE METABOLIC PANEL
ALBUMIN: 4.2 g/dL (ref 3.5–5.5)
ALK PHOS: 93 IU/L (ref 39–117)
ALT: 24 IU/L (ref 0–32)
AST: 21 IU/L (ref 0–40)
Albumin/Globulin Ratio: 1.8 (ref 1.2–2.2)
BUN / CREAT RATIO: 19 (ref 9–23)
BUN: 12 mg/dL (ref 6–24)
Bilirubin Total: 0.4 mg/dL (ref 0.0–1.2)
CO2: 26 mmol/L (ref 20–29)
CREATININE: 0.64 mg/dL (ref 0.57–1.00)
Calcium: 9.4 mg/dL (ref 8.7–10.2)
Chloride: 100 mmol/L (ref 96–106)
GFR calc non Af Amer: 99 mL/min/{1.73_m2} (ref >59)
GFR, EST AFRICAN AMERICAN: 114 mL/min/{1.73_m2} (ref >59)
GLOBULIN, TOTAL: 2.4 g/dL (ref 1.5–4.5)
Glucose: 83 mg/dL (ref 65–99)
Potassium: 4.2 mmol/L (ref 3.5–5.2)
SODIUM: 140 mmol/L (ref 134–144)
TOTAL PROTEIN: 6.6 g/dL (ref 6.0–8.5)

## 2018-03-29 MED ORDER — METRONIDAZOLE 500 MG PO TABS: 500.0000 mg | ORAL_TABLET | Freq: Three times a day (TID) | ORAL | 0 refills | Status: AC

## 2018-04-07 ENCOUNTER — Other Ambulatory Visit: Payer: Self-pay | Admitting: Internal Medicine

## 2018-04-07 ENCOUNTER — Telehealth: Payer: Self-pay | Admitting: Internal Medicine

## 2018-04-07 DIAGNOSIS — R197 Diarrhea, unspecified: Secondary | ICD-10-CM

## 2018-04-07 MED ORDER — METRONIDAZOLE 500 MG PO TABS: 500.0000 mg | ORAL_TABLET | Freq: Three times a day (TID) | ORAL | 0 refills | Status: DC

## 2018-04-07 NOTE — Telephone Encounter (Signed)
Per last note: will start flagyl if persistent ..I have been unable to reach this patient by phone.  Wants referral GI

## 2018-04-07 NOTE — Telephone Encounter (Signed)
Pt still having diarrhea mention dr.b has nothing available for today and that she could try going in to urgent care or setting up an appt for Monday. She wants to have a referral to GI.

## 2018-04-07 NOTE — Telephone Encounter (Signed)
Will send in Flagyl.  GI will not see her for an urgent issue.  After flagyl is complete and if she is still having more problems, then we can discuss referral.

## 2018-04-07 NOTE — Telephone Encounter (Signed)
Referral to see Dr Elmore Guise

## 2018-04-20 ENCOUNTER — Ambulatory Visit (INDEPENDENT_AMBULATORY_CARE_PROVIDER_SITE_OTHER): Payer: BC Managed Care – PPO | Admitting: Internal Medicine

## 2018-04-20 ENCOUNTER — Encounter: Payer: Self-pay | Admitting: Internal Medicine

## 2018-04-20 VITALS — BP 122/76 | HR 82 | Ht 65.0 in | Wt 167.0 lb

## 2018-04-20 DIAGNOSIS — Z1159 Encounter for screening for other viral diseases: Secondary | ICD-10-CM | POA: Diagnosis not present

## 2018-04-20 DIAGNOSIS — R197 Diarrhea, unspecified: Secondary | ICD-10-CM

## 2018-04-20 DIAGNOSIS — E782 Mixed hyperlipidemia: Secondary | ICD-10-CM | POA: Diagnosis not present

## 2018-04-20 DIAGNOSIS — Z0000 Encounter for general adult medical examination without abnormal findings: Secondary | ICD-10-CM

## 2018-04-20 DIAGNOSIS — Z1239 Encounter for other screening for malignant neoplasm of breast: Secondary | ICD-10-CM

## 2018-04-20 DIAGNOSIS — Z1231 Encounter for screening mammogram for malignant neoplasm of breast: Secondary | ICD-10-CM

## 2018-04-20 DIAGNOSIS — Z0001 Encounter for general adult medical examination with abnormal findings: Secondary | ICD-10-CM

## 2018-04-20 LAB — POCT URINALYSIS DIPSTICK
Bilirubin, UA: NEGATIVE
Blood, UA: NEGATIVE
Glucose, UA: NEGATIVE
Ketones, UA: NEGATIVE
Leukocytes, UA: NEGATIVE
Nitrite, UA: NEGATIVE
PH UA: 5 (ref 5.0–8.0)
PROTEIN UA: NEGATIVE
Spec Grav, UA: 1.015 (ref 1.010–1.025)
Urobilinogen, UA: 0.2 E.U./dL

## 2018-04-20 NOTE — Progress Notes (Signed)
Date:  04/20/2018   Name:  Lauren Barrera   DOB:  19-Jul-1961   MRN:  [id number]   Chief Complaint: Annual Exam and Diarrhea (Has felt a little better but not resolved yet. ) Lauren Barrera is a 57 y.o. female who presents today for her Complete Annual Exam. She feels fairly well. She reports exercising walking. She reports she is sleeping well. Mammogram is up to date - due in August.  Pap done last 10/18.  Colonoscopy done in 2012.  Diarrhea   This is a new (seen about three weeks ago - eventually prescribed flagyl for presumed giardia) problem. The current episode started 1 to 4 weeks ago. The problem has been gradually improving. The stool consistency is described as watery (at times and more formed at times). Pertinent negatives include no abdominal pain, arthralgias, chills, coughing, fever, headaches or vomiting.     Review of Systems  Constitutional: Negative for chills, fatigue, fever and unexpected weight change.  HENT: Negative for congestion, hearing loss, tinnitus, trouble swallowing and voice change.   Eyes: Negative for visual disturbance.  Respiratory: Negative for cough, chest tightness, shortness of breath and wheezing.   Cardiovascular: Negative for chest pain, palpitations and leg swelling.  Gastrointestinal: Positive for diarrhea. Negative for abdominal pain, constipation and vomiting.  Endocrine: Negative for polydipsia and polyuria.  Genitourinary: Negative for dysuria, frequency, genital sores, vaginal bleeding and vaginal discharge.  Musculoskeletal: Negative for arthralgias, gait problem and joint swelling.  Skin: Negative for color change and rash.  Neurological: Negative for dizziness, tremors, light-headedness and headaches.  Hematological: Negative for adenopathy. Does not bruise/bleed easily.  Psychiatric/Behavioral: Negative for dysphoric mood and sleep disturbance. The patient is not nervous/anxious.     Patient Active Problem List   Diagnosis Date Noted  .  Status post LEEP (loop electrosurgical excision procedure) of cervix 06/23/2016  . Dysplasia of cervix, low grade (CIN 1) 04/29/2016  . 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 Constipation due to slow transit 02/23/2016  . Hyperlipidemia 06/23/2015  . 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 Anxiety and depression May 17, 2015  . ADD (attention deficit disorder) May 17, 2015  . Allergic state 05/17/15  . Family history of sudden death 05-17-2015  . Bloodgood disease 05/17/15  . Chronic interstitial cystitis 05-17-15  . Hypochromic microcytic anemia May 17, 2015  . Obstructive sleep apnea of adult 2015/05/17    Prior to Admission medications   Medication Sig Start Date End Date Taking? Authorizing Provider  Calcium-Magnesium-Vitamin D [id number] MG-MG-UNIT TB24 Take by mouth.   Yes [provider]  citalopram (CELEXA) 40 MG tablet Take 40 mg by mouth daily.   Yes [provider]  DULoxetine (CYMBALTA) 60 MG capsule Take 60 mg by mouth daily.   Yes [provider]  magnesium gluconate (MAGONATE) 500 MG tablet Take 500 mg by mouth 2 (two) times daily.   Yes [provider]  metroNIDAZOLE (FLAGYL) 500 MG tablet Take 1 tablet (500 mg total) by mouth 3 (three) times daily. 04/07/18  Yes Glean Hess, MD  Multiple Vitamins-Minerals (MULTIVITAMIN WITH MINERALS) tablet Take 1 tablet by mouth daily.   Yes [provider]  Probiotic Product (PROBIOTIC-10 ULTIMATE PO) Take by mouth.   Yes [provider]    Allergies  Allergen Reactions  . Codeine Other (See Comments)    Other Reaction: causes prickly sensation    Past Surgical History:  Procedure Laterality Date  . BREAST EXCISIONAL BIOPSY Bilateral 1976  . COLONOSCOPY  10/08/2008   Dr. Allen Norris - normal; repeat 10 yr  . DILATION AND CURETTAGE OF UTERUS     x2  . LEEP N/A 06/14/2016   Procedure: LOOP ELECTROSURGICAL EXCISION PROCEDURE (LEEP);  Surgeon: Brayton Mars, MD;   Location: [facility] ORS;  Service: Gynecology;  Laterality: N/A;  . MOHS SURGERY     Left Leg- behind knee   . 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 SHOULDER ARTHROSCOPY W/ ROTATOR CUFF REPAIR Right     Social History   Tobacco Use  . Smoking status: Never Smoker  . Smokeless tobacco: Never Used  Substance Use Topics  . Alcohol use: Yes    Alcohol/week: 0.0 oz    Comment: occasional  . Drug use: No     Medication list has been reviewed and updated.  PHQ 2/9 Scores 03/28/2018 10/20/2017 04/19/2017 02/04/2016  PHQ - 2 Score 0 0 0 0  PHQ- 9 Score 0 0 - -    Physical Exam  Constitutional: She is oriented to person, place, and time. She appears well-developed and well-nourished. No distress.  HENT:  Head: Normocephalic and atraumatic.  Right Ear: Tympanic membrane and ear canal normal.  Left Ear: Tympanic membrane and ear canal normal.  Nose: Right sinus exhibits no maxillary sinus tenderness. Left sinus exhibits no maxillary sinus tenderness.  Mouth/Throat: Uvula is midline and oropharynx is clear and moist.  Eyes: Conjunctivae and EOM are normal. Right eye exhibits no discharge. Left eye exhibits no discharge. No scleral icterus.  Neck: Normal range of motion. Carotid bruit is not present. No erythema present. No thyromegaly present.  Cardiovascular: Normal rate, regular rhythm, normal heart sounds, intact distal pulses and normal pulses.  Pulmonary/Chest: Effort normal. No respiratory distress. She has no wheezes. Right breast exhibits no mass, no nipple discharge, no skin change and no tenderness. Left breast exhibits no mass, no nipple discharge, no skin change and no tenderness.  Abdominal: Soft. Bowel sounds are normal. She exhibits no distension and no mass. There is no hepatosplenomegaly. There is no tenderness. There is no rebound and no CVA tenderness.  Musculoskeletal: Normal range of motion.  Lymphadenopathy:    She has no cervical adenopathy.     She has no axillary adenopathy.  Neurological: She is alert and oriented to person, place, and time. She has normal reflexes. No cranial nerve deficit or sensory deficit.  Skin: Skin is warm, dry and intact. No rash noted.  Psychiatric: She has a normal mood and affect. Her speech is normal and behavior is normal. Thought content normal.  Nursing note and vitals reviewed.   BP 122/76   Pulse 82   Ht 5\' 5"  (1.651 m)   Wt 167 lb (75.8 kg)   SpO2 96%   BMI 27.79 kg/m   Assessment and Plan: 1. Annual physical exam Normal exam except for BMI Encourage healthy diet and exercise - TSH - POCT urinalysis dipstick  2. Breast cancer screening Schedule in August - MM DIGITAL SCREENING BILATERAL; Future  3. Need for hepatitis C screening test - Hepatitis C antibody  4. Mixed hyperlipidemia Not on any medication - Lipid panel  5. Diarrhea of presumed infectious origin Continue imodium as needed If workup negative, will refer to GI - CBC with Differential/Platelet - Comprehensive metabolic panel - Cdiff NAA+O+P+Stool Culture   No orders of the defined types were placed in this encounter.   Partially dictated using Editor, commissioning. Any errors are unintentional.  Halina Maidens,  MD [facility]  04/20/2018

## 2018-04-21 LAB — CBC WITH DIFFERENTIAL/PLATELET
Basophils Absolute: 0 10*3/uL (ref 0.0–0.2)
Basos: 1 %
EOS (ABSOLUTE): 0 10*3/uL (ref 0.0–0.4)
EOS: 1 %
HEMATOCRIT: 43 % (ref 34.0–46.6)
HEMOGLOBIN: 14.1 g/dL (ref 11.1–15.9)
IMMATURE GRANS (ABS): 0 10*3/uL (ref 0.0–0.1)
Immature Granulocytes: 0 %
Lymphocytes Absolute: 1.2 10*3/uL (ref 0.7–3.1)
Lymphs: 20 %
MCH: 30.7 pg (ref 26.6–33.0)
MCHC: 32.8 g/dL (ref 31.5–35.7)
MCV: 94 fL (ref 79–97)
MONOCYTES: 9 %
Monocytes Absolute: 0.6 10*3/uL (ref 0.1–0.9)
NEUTROS ABS: 4.5 10*3/uL (ref 1.4–7.0)
Neutrophils: 69 %
Platelets: 276 10*3/uL (ref 150–379)
RBC: 4.59 x10E6/uL (ref 3.77–5.28)
RDW: 13 % (ref 12.3–15.4)
WBC: 6.4 10*3/uL (ref 3.4–10.8)

## 2018-04-21 LAB — HEPATITIS C ANTIBODY

## 2018-04-21 LAB — COMPREHENSIVE METABOLIC PANEL
ALBUMIN: 4.1 g/dL (ref 3.5–5.5)
ALK PHOS: 80 IU/L (ref 39–117)
ALT: 21 IU/L (ref 0–32)
AST: 21 IU/L (ref 0–40)
Albumin/Globulin Ratio: 1.6 (ref 1.2–2.2)
BUN / CREAT RATIO: 23 (ref 9–23)
BUN: 15 mg/dL (ref 6–24)
Bilirubin Total: 0.5 mg/dL (ref 0.0–1.2)
CO2: 25 mmol/L (ref 20–29)
CREATININE: 0.64 mg/dL (ref 0.57–1.00)
Calcium: 9.6 mg/dL (ref 8.7–10.2)
Chloride: 98 mmol/L (ref 96–106)
GFR calc non Af Amer: 99 mL/min/{1.73_m2} (ref >59)
GFR, EST AFRICAN AMERICAN: 114 mL/min/{1.73_m2} (ref >59)
Globulin, Total: 2.5 g/dL (ref 1.5–4.5)
Glucose: 110 mg/dL — ABNORMAL HIGH (ref 65–99)
Potassium: 4.6 mmol/L (ref 3.5–5.2)
SODIUM: 139 mmol/L (ref 134–144)
TOTAL PROTEIN: 6.6 g/dL (ref 6.0–8.5)

## 2018-04-21 LAB — LIPID PANEL
Chol/HDL Ratio: 4.2 ratio (ref 0.0–4.4)
Cholesterol, Total: 237 mg/dL — ABNORMAL HIGH (ref 100–199)
HDL: 56 mg/dL (ref >39)
LDL CALC: 136 mg/dL — AB (ref 0–99)
Triglycerides: 227 mg/dL — ABNORMAL HIGH (ref 0–149)
VLDL CHOLESTEROL CAL: 45 mg/dL — AB (ref 5–40)

## 2018-04-21 LAB — TSH: TSH: 4.07 u[IU]/mL (ref 0.450–4.500)

## 2018-05-23 LAB — CDIFF NAA+O+P+STOOL CULTURE
E COLI SHIGA TOXIN ASSAY: NEGATIVE
Toxigenic C. Difficile by PCR: NEGATIVE

## 2018-08-15 ENCOUNTER — Ambulatory Visit
Admission: RE | Admit: 2018-08-15 | Discharge: 2018-08-15 | Disposition: A | Discharge status: Home / Self Care | Payer: BC Managed Care – PPO | Source: Ambulatory Visit | Attending: Internal Medicine | Admitting: Internal Medicine

## 2018-08-15 DIAGNOSIS — Z1239 Encounter for other screening for malignant neoplasm of breast: Secondary | ICD-10-CM

## 2018-08-15 DIAGNOSIS — Z1231 Encounter for screening mammogram for malignant neoplasm of breast: Secondary | ICD-10-CM | POA: Insufficient documentation

## 2019-04-25 ENCOUNTER — Encounter: Payer: Self-pay | Admitting: Internal Medicine

## 2019-04-25 ENCOUNTER — Other Ambulatory Visit: Payer: Self-pay

## 2019-04-25 ENCOUNTER — Ambulatory Visit (INDEPENDENT_AMBULATORY_CARE_PROVIDER_SITE_OTHER): Payer: BC Managed Care – PPO | Admitting: Internal Medicine

## 2019-04-25 VITALS — BP 108/74 | HR 78 | Ht 65.0 in | Wt 169.0 lb

## 2019-04-25 DIAGNOSIS — Z1231 Encounter for screening mammogram for malignant neoplasm of breast: Secondary | ICD-10-CM

## 2019-04-25 DIAGNOSIS — Z0000 Encounter for general adult medical examination without abnormal findings: Secondary | ICD-10-CM

## 2019-04-25 DIAGNOSIS — F329 Major depressive disorder, single episode, unspecified: Secondary | ICD-10-CM | POA: Diagnosis not present

## 2019-04-25 DIAGNOSIS — F419 Anxiety disorder, unspecified: Secondary | ICD-10-CM | POA: Diagnosis not present

## 2019-04-25 LAB — POCT URINALYSIS DIPSTICK
Bilirubin, UA: NEGATIVE
Blood, UA: NEGATIVE
Glucose, UA: NEGATIVE
Ketones, UA: NEGATIVE
Leukocytes, UA: NEGATIVE
Nitrite, UA: NEGATIVE
Protein, UA: POSITIVE — AB
Spec Grav, UA: >=1.03 — AB
Urobilinogen, UA: 0.2 U/dL
pH, UA: 5

## 2019-04-25 NOTE — Progress Notes (Signed)
Date:  04/25/2019   Name:  Lauren Barrera   DOB:  1961/08/23   MRN:  [id number]   Chief Complaint: Annual Exam (Breast Exam. No pap.) [name] is a 58 y.o. female who presents today for her Complete Annual Exam. She feels well. She reports exercising some. She reports she is sleeping fairly well. She is planning to retire this year and is excited about it.  Mammogram 08/2018 Colonoscopy 2012 Pap smear 2018  Depression         This is a chronic (she is seeing Dr. Nicolasa Ducking psychiatry) problem.  The problem has been resolved since onset.  Associated symptoms include no fatigue and no headaches.   Review of Systems  Constitutional: Negative for chills, fatigue and fever.  HENT: Negative for congestion, hearing loss, tinnitus, trouble swallowing and voice change.   Eyes: Negative for visual disturbance.  Respiratory: Negative for cough, chest tightness, shortness of breath and wheezing.   Cardiovascular: Negative for chest pain, palpitations and leg swelling.  Gastrointestinal: Negative for abdominal pain, constipation, diarrhea and vomiting.  Endocrine: Negative for polydipsia and polyuria.  Genitourinary: Negative for dysuria, frequency, genital sores, vaginal bleeding and vaginal discharge.  Musculoskeletal: Negative for arthralgias, gait problem and joint swelling.  Skin: Negative for color change and rash.  Allergic/Immunologic: Negative for environmental allergies.  Neurological: Negative for dizziness, tremors, light-headedness and headaches.  Hematological: Negative for adenopathy. Does not bruise/bleed easily.  Psychiatric/Behavioral: Positive for depression. Negative for dysphoric mood and sleep disturbance. The patient is not nervous/anxious.     Patient Active Problem List   Diagnosis Date Noted  . Status post LEEP (loop electrosurgical excision procedure) of cervix 06/23/2016  . Dysplasia of cervix, low grade (CIN 1) 04/29/2016  . 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  Constipation due to slow transit 02/23/2016  . Hyperlipidemia 06/23/2015  . 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 Anxiety and depression 2015-05-26  . ADD (attention deficit disorder) 05-26-15  . Allergic state 2015-05-26  . Family history of sudden death 2015/05/26  . Bloodgood disease 2015/05/26  . Chronic interstitial cystitis 2015-05-26  . Hypochromic microcytic anemia 05/26/2015  . Obstructive sleep apnea of adult 05/26/15    Allergies  Allergen Reactions  . Codeine Other (See Comments)    Other Reaction: causes prickly sensation    Past Surgical History:  Procedure Laterality Date  . BREAST EXCISIONAL BIOPSY Bilateral 1976   neg  . COLONOSCOPY  10/08/2008   Dr. Allen Norris - normal; repeat 10 yr  . DILATION AND CURETTAGE OF UTERUS     x2  . LEEP N/A 06/14/2016   Procedure: LOOP ELECTROSURGICAL EXCISION PROCEDURE (LEEP);  Surgeon: Brayton Mars, MD;  Location: [facility] ORS;  Service: Gynecology;  Laterality: N/A;  . MOHS SURGERY     Left Leg- behind knee   . 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 SHOULDER ARTHROSCOPY W/ ROTATOR CUFF REPAIR Right     Social History   Tobacco Use  . Smoking status: Never Smoker  . Smokeless tobacco: Never Used  Substance Use Topics  . Alcohol use: Yes    Alcohol/week: 0.0 standard drinks    Comment: occasional  . Drug use: No     Medication list has been reviewed and updated.  Current Meds  Medication Sig  . Coenzyme Q10 (CO Q 10 PO) Take by mouth.  . DULoxetine (CYMBALTA) 60 MG capsule Take 60 mg by mouth daily.  Marland Kitchen  escitalopram (LEXAPRO) 10 MG tablet Take 10 mg by mouth daily.  . Multiple Vitamins-Minerals (MULTIVITAMIN WITH MINERALS) tablet Take 1 tablet by mouth daily.  . TURMERIC CURCUMIN PO Take by mouth.    PHQ 2/9 Scores 04/25/2019 03/28/2018 10/20/2017 04/19/2017  PHQ - 2 Score 0 0 0 0  PHQ- 9 Score - 0 0 -    BP Readings from Last 3 Encounters:  04/25/19 108/74  04/20/18 122/76   03/28/18 (!) 140/92    Physical Exam Vitals signs and nursing note reviewed.  Constitutional:      General: She is not in acute distress.    Appearance: Normal appearance. She is well-developed.  HENT:     Head: Normocephalic and atraumatic.     Right Ear: Tympanic membrane and ear canal normal.     Left Ear: Tympanic membrane and ear canal normal.     Mouth/Throat:     Mouth: Mucous membranes are moist.     Pharynx: Uvula midline.  Eyes:     General: No scleral icterus.       Right eye: No discharge.        Left eye: No discharge.     Conjunctiva/sclera: Conjunctivae normal.  Neck:     Musculoskeletal: Normal range of motion. No erythema.     Thyroid: No thyromegaly.     Vascular: No carotid bruit.  Cardiovascular:     Rate and Rhythm: Normal rate and regular rhythm.     Pulses: Normal pulses.     Heart sounds: Normal heart sounds.  Pulmonary:     Effort: Pulmonary effort is normal. No respiratory distress.     Breath sounds: No wheezing.  Chest:     Breasts:        Right: No mass, nipple discharge, skin change or tenderness.        Left: No mass, nipple discharge, skin change or tenderness.  Abdominal:     General: Bowel sounds are normal.     Palpations: Abdomen is soft.     Tenderness: There is no abdominal tenderness.  Musculoskeletal: Normal range of motion.     Right lower leg: No edema.     Left lower leg: No edema.  Lymphadenopathy:     Cervical: No cervical adenopathy.  Skin:    General: Skin is warm and dry.     Capillary Refill: Capillary refill takes less than 2 seconds.     Findings: No rash.  Neurological:     Mental Status: She is alert and oriented to person, place, and time.     Cranial Nerves: No cranial nerve deficit.     Sensory: No sensory deficit.     Deep Tendon Reflexes: Reflexes are normal and symmetric.  Psychiatric:        Speech: Speech normal.        Behavior: Behavior normal.        Thought Content: Thought content normal.      Wt Readings from Last 3 Encounters:  04/25/19 169 lb (76.7 kg)  04/20/18 167 lb (75.8 kg)  03/28/18 167 lb (75.8 kg)    BP 108/74   Pulse 78   Ht 5\' 5"  (1.651 m)   Wt 169 lb (76.7 kg)   SpO2 96%   BMI 28.12 kg/m   Assessment and Plan: 1. Annual physical exam Normal exam Continue healthy diet and exercise - CBC with Differential/Platelet - Comprehensive metabolic panel - Lipid panel - TSH + free T4 - POCT urinalysis dipstick  2. Encounter for screening mammogram for breast cancer - MM 3D SCREEN BREAST BILATERAL; Future  3. Anxiety and depression Doing well on medications Continue follow up with Psychiatry   Partially dictated using Dragon software. Any errors are unintentional.  Halina Maidens, MD [facility]  04/25/2019

## 2019-04-26 LAB — TSH+FREE T4
Free T4: 0.86 ng/dL (ref 0.82–1.77)
TSH: 6.04 u[IU]/mL — ABNORMAL HIGH (ref 0.450–4.500)

## 2019-04-26 LAB — CBC WITH DIFFERENTIAL/PLATELET
Basophils Absolute: 0.1 10*3/uL (ref 0.0–0.2)
Basos: 1 %
EOS (ABSOLUTE): 0.1 10*3/uL (ref 0.0–0.4)
Eos: 1 %
Hematocrit: 44 % (ref 34.0–46.6)
Hemoglobin: 15.1 g/dL (ref 11.1–15.9)
Immature Grans (Abs): 0 10*3/uL (ref 0.0–0.1)
Immature Granulocytes: 0 %
Lymphocytes Absolute: 1.7 10*3/uL (ref 0.7–3.1)
Lymphs: 22 %
MCH: 31 pg (ref 26.6–33.0)
MCHC: 34.3 g/dL (ref 31.5–35.7)
MCV: 90 fL (ref 79–97)
Monocytes Absolute: 0.8 10*3/uL (ref 0.1–0.9)
Monocytes: 10 %
Neutrophils Absolute: 5 10*3/uL (ref 1.4–7.0)
Neutrophils: 66 %
Platelets: 234 10*3/uL (ref 150–450)
RBC: 4.87 x10E6/uL (ref 3.77–5.28)
RDW: 12.1 % (ref 11.7–15.4)
WBC: 7.7 10*3/uL (ref 3.4–10.8)

## 2019-04-26 LAB — COMPREHENSIVE METABOLIC PANEL
ALT: 48 IU/L — ABNORMAL HIGH (ref 0–32)
AST: 33 IU/L (ref 0–40)
Albumin/Globulin Ratio: 1.6 (ref 1.2–2.2)
Albumin: 4.1 g/dL (ref 3.8–4.9)
Alkaline Phosphatase: 110 IU/L (ref 39–117)
BUN/Creatinine Ratio: 16 (ref 9–23)
BUN: 13 mg/dL (ref 6–24)
Bilirubin Total: 0.5 mg/dL (ref 0.0–1.2)
CO2: 20 mmol/L (ref 20–29)
Calcium: 9.3 mg/dL (ref 8.7–10.2)
Chloride: 101 mmol/L (ref 96–106)
Creatinine, Ser: 0.79 mg/dL (ref 0.57–1.00)
GFR calc Af Amer: 95 mL/min/{1.73_m2} (ref >59)
GFR calc non Af Amer: 82 mL/min/{1.73_m2} (ref >59)
Globulin, Total: 2.6 g/dL (ref 1.5–4.5)
Glucose: 174 mg/dL — ABNORMAL HIGH (ref 65–99)
Potassium: 4.3 mmol/L (ref 3.5–5.2)
Sodium: 136 mmol/L (ref 134–144)
Total Protein: 6.7 g/dL (ref 6.0–8.5)

## 2019-04-26 LAB — LIPID PANEL
Chol/HDL Ratio: 4.8 ratio — ABNORMAL HIGH (ref 0.0–4.4)
Cholesterol, Total: 245 mg/dL — ABNORMAL HIGH (ref 100–199)
HDL: 51 mg/dL (ref >39)
LDL Calculated: 150 mg/dL — ABNORMAL HIGH (ref 0–99)
Triglycerides: 219 mg/dL — ABNORMAL HIGH (ref 0–149)
VLDL Cholesterol Cal: 44 mg/dL — ABNORMAL HIGH (ref 5–40)

## 2019-05-08 ENCOUNTER — Encounter: Payer: Self-pay | Admitting: Internal Medicine

## 2019-05-08 DIAGNOSIS — E119 Type 2 diabetes mellitus without complications: Secondary | ICD-10-CM | POA: Insufficient documentation

## 2019-05-08 LAB — SPECIMEN STATUS REPORT

## 2019-05-08 LAB — HGB A1C W/O EAG: Hgb A1c MFr Bld: 6.8 % — ABNORMAL HIGH (ref 4.8–5.6)

## 2019-05-15 ENCOUNTER — Encounter: Payer: Self-pay | Admitting: Internal Medicine

## 2019-05-15 ENCOUNTER — Ambulatory Visit: Payer: BC Managed Care – PPO | Admitting: Internal Medicine

## 2019-05-15 ENCOUNTER — Other Ambulatory Visit: Payer: Self-pay

## 2019-05-15 VITALS — BP 122/74 | HR 80 | Ht 65.0 in | Wt 169.0 lb

## 2019-05-15 DIAGNOSIS — E119 Type 2 diabetes mellitus without complications: Secondary | ICD-10-CM | POA: Diagnosis not present

## 2019-05-15 MED ORDER — BLOOD GLUCOSE METER KIT: PACK | 0 refills | Status: AC

## 2019-05-15 NOTE — Patient Instructions (Addendum)
Check blood sugar fasting 2 mornings a week  Check randomly - eg 2 hours after a meal - intermittently as needed  Look up Glycemic Index for foods

## 2019-05-15 NOTE — Progress Notes (Signed)
Date:  05-19-19   Name:  Lauren Barrera   DOB:  November 28, 1961   MRN:  [id number]   Chief Complaint: Diabetes  Diabetes  She presents for her initial diabetic visit. She has type 2 diabetes mellitus. Onset time: new onset. Pertinent negatives for hypoglycemia include no dizziness or headaches. Pertinent negatives for diabetes include no chest pain, no fatigue, no foot paresthesias, no foot ulcerations, no polydipsia, no polyuria, no visual change and no weight loss. When asked about current treatments, none were reported.  She has already started working on diet changes with intermittent fasting and limited carbs.  She is walking every day.  Home scales report a 5 lb loss since last visit.  Lab Results  Component Value Date   HGBA1C 6.8 (H) 04/25/2019   Lab Results  Component Value Date   CREATININE 0.79 04/25/2019   BUN 13 04/25/2019   NA 136 04/25/2019   K 4.3 04/25/2019   CL 101 04/25/2019   CO2 20 04/25/2019      Review of Systems  Constitutional: Negative for chills, fatigue, fever, unexpected weight change and weight loss.  Eyes: Negative for visual disturbance.  Respiratory: Negative for shortness of breath.   Cardiovascular: Negative for chest pain and leg swelling.  Endocrine: Negative for polydipsia and polyuria.  Neurological: Negative for dizziness and headaches.    Patient Active Problem List   Diagnosis Date Noted  . Diabetes mellitus without complication ([place]) 54/65/0354  . Status post LEEP (loop electrosurgical excision procedure) of cervix 06/23/2016  . Dysplasia of cervix, low grade (CIN 1) 04/29/2016  . 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 Constipation due to slow transit 02/23/2016  . Hyperlipidemia 06/23/2015  . 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 Anxiety and depression 05/19/15  . ADD (attention deficit disorder) 05/19/2015  . Allergic state May 19, 2015  . Family history of sudden death May 19, 2015  . Bloodgood disease 05-19-15  .  Chronic interstitial cystitis May 19, 2015  . Hypochromic microcytic anemia 05-19-2015  . Obstructive sleep apnea of adult 19-May-2015    Allergies  Allergen Reactions  . Codeine Other (See Comments)    Other Reaction: causes prickly sensation    Past Surgical History:  Procedure Laterality Date  . BREAST EXCISIONAL BIOPSY Bilateral 1976   neg  . COLONOSCOPY  10/08/2008   Dr. Allen Barrera - normal; repeat 10 yr  . DILATION AND CURETTAGE OF UTERUS     x2  . LEEP N/A 06/14/2016   Procedure: LOOP ELECTROSURGICAL EXCISION PROCEDURE (LEEP);  Surgeon: Brayton Mars, MD;  Location: [facility] ORS;  Service: Gynecology;  Laterality: N/A;  . MOHS SURGERY     Left Leg- behind knee   . 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 SHOULDER ARTHROSCOPY W/ ROTATOR CUFF REPAIR Right     Social History   Tobacco Use  . Smoking status: Never Smoker  . Smokeless tobacco: Never Used  Substance Use Topics  . Alcohol use: Yes    Alcohol/week: 0.0 standard drinks    Comment: occasional  . Drug use: No     Medication list has been reviewed and updated.  Current Meds  Medication Sig  . Coenzyme Q10 (CO Q 10 PO) Take by mouth.  . DULoxetine (CYMBALTA) 60 MG capsule Take 60 mg by mouth daily.  Marland Kitchen escitalopram (LEXAPRO) 10 MG tablet Take 10 mg by mouth daily.  . Multiple Vitamins-Minerals (MULTIVITAMIN WITH MINERALS) tablet Take 1 tablet by mouth daily.  PHQ 2/9 Scores 05/15/2019 04/25/2019 03/28/2018 10/20/2017  PHQ - 2 Score 0 0 0 0  PHQ- 9 Score - - 0 0    BP Readings from Last 3 Encounters:  05/15/19 122/74  04/25/19 108/74  04/20/18 122/76    Physical Exam Vitals signs and nursing note reviewed.  Constitutional:      General: She is not in acute distress.    Appearance: She is well-developed.  HENT:     Head: Normocephalic and atraumatic.  Neck:     Musculoskeletal: Normal range of motion.  Cardiovascular:     Rate and Rhythm: Normal rate and regular rhythm.   Pulmonary:     Effort: Pulmonary effort is normal. No respiratory distress.     Breath sounds: No wheezing or rhonchi.  Musculoskeletal: Normal range of motion.     Right lower leg: No edema.     Left lower leg: No edema.  Lymphadenopathy:     Cervical: No cervical adenopathy.  Skin:    General: Skin is warm and dry.     Findings: No rash.  Neurological:     Mental Status: She is alert and oriented to person, place, and time.  Psychiatric:        Behavior: Behavior normal.        Thought Content: Thought content normal.     Wt Readings from Last 3 Encounters:  05/15/19 169 lb (76.7 kg)  04/25/19 169 lb (76.7 kg)  04/20/18 167 lb (75.8 kg)    BP 122/74   Pulse 80   Ht 5' 5" (1.651 m)   Wt 169 lb (76.7 kg)   SpO2 96%   BMI 28.12 kg/m   Assessment and Plan: 1. Diabetes mellitus without complication (HCC) LSC, weight loss Check BS several times per week fasting and randomly as needed - blood glucose meter kit and supplies; Dispense based on patient and insurance preference. Use up to two times daily as directed. (FOR ICD-10 E10.9, E11.9).  Dispense: 1 each; Refill: 0 - Hemoglobin A1c   Partially dictated using Editor, commissioning. Any errors are unintentional.  Halina Maidens, MD [facility]  05/15/2019

## 2019-05-16 LAB — HEMOGLOBIN A1C
Est. average glucose Bld gHb Est-mCnc: 154 mg/dL
Hgb A1c MFr Bld: 7 % — ABNORMAL HIGH (ref 4.8–5.6)

## 2019-06-26 ENCOUNTER — Other Ambulatory Visit: Payer: Self-pay | Admitting: Internal Medicine

## 2019-07-13 ENCOUNTER — Telehealth: Payer: Self-pay

## 2019-07-13 NOTE — Telephone Encounter (Signed)
Patient called saying she is driving home from the beach and is having sxs of a UTI. Burning, Pain, Urgency. Tried Azo with no relief. Wants abx sent to her pharmacy.  Called pt and informed Dr. Army Melia is on vacation, and we would need to see her before we call in any abx. Told her since Army Melia is out of town and its Friday- its probably best for her to see UC when she gets back in town. Told her we wouldn't want to wait until Monday and infection get worse.   She verbalized understanding.

## 2019-07-25 ENCOUNTER — Encounter: Payer: Self-pay | Admitting: Internal Medicine

## 2019-07-25 ENCOUNTER — Other Ambulatory Visit: Payer: Self-pay

## 2019-07-25 ENCOUNTER — Ambulatory Visit: Payer: BC Managed Care – PPO | Admitting: Internal Medicine

## 2019-07-25 VITALS — BP 132/84 | HR 81 | Ht 65.0 in | Wt 167.0 lb

## 2019-07-25 DIAGNOSIS — N3000 Acute cystitis without hematuria: Secondary | ICD-10-CM

## 2019-07-25 LAB — POCT URINALYSIS DIPSTICK
Bilirubin, UA: NEGATIVE
Blood, UA: NEGATIVE
Glucose, UA: NEGATIVE
Ketones, UA: NEGATIVE
Leukocytes, UA: NEGATIVE
Nitrite, UA: NEGATIVE
Protein, UA: NEGATIVE
Spec Grav, UA: 1.015 (ref 1.010–1.025)
Urobilinogen, UA: 0.2 E.U./dL
pH, UA: 6 (ref 5.0–8.0)

## 2019-07-25 MED ORDER — CIPROFLOXACIN HCL 250 MG PO TABS: 250.0000 mg | ORAL_TABLET | Freq: Two times a day (BID) | ORAL | 0 refills | Status: AC

## 2019-07-25 NOTE — Progress Notes (Signed)
Date:  07/25/2019   Name:  Lauren Barrera   DOB:  Aug 23, 1961   MRN:  [id number]   Chief Complaint: Urinary Tract Infection (Was treated on 08/7 with diflucan and 10 days of cirpo. Just inished meds Monday night. During the night having urgency, and pain.)  Urinary Tract Infection  Episode onset: 2 weeks ago seen at [facility] - culture positive for E coli - pan sensitive. The problem has been resolved (but last night started to have urgency and pain). Associated symptoms include frequency. Pertinent negatives include no chills or urgency.  She has a hx of IC but has not had recurrent sx and is not on medication. She has seen Dr. Rogers Blocker in the past.  Review of Systems  Constitutional: Negative for chills, fatigue and fever.  Respiratory: Negative for chest tightness and shortness of breath.   Cardiovascular: Negative for chest pain and leg swelling.  Gastrointestinal: Negative for abdominal pain, constipation and diarrhea.  Genitourinary: Positive for dysuria and frequency. Negative for urgency.    Patient Active Problem List   Diagnosis Date Noted  . Diabetes mellitus without complication ([place]) 40/98/1191  . Status post LEEP (loop electrosurgical excision procedure) of cervix 06/23/2016  . Dysplasia of cervix, low grade (CIN 1) 04/29/2016  . 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 Constipation due to slow transit 02/23/2016  . Hyperlipidemia 06/23/2015  . 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 Anxiety and depression 05-26-2015  . ADD (attention deficit disorder) 05-26-2015  . Allergic state 26-May-2015  . Family history of sudden death May 26, 2015  . Bloodgood disease May 26, 2015  . Chronic interstitial cystitis 26-May-2015  . Hypochromic microcytic anemia 2015-05-26  . Obstructive sleep apnea of adult 05/26/15    Allergies  Allergen Reactions  . Codeine Other (See Comments)    Other Reaction: causes prickly sensation    Past Surgical History:  Procedure Laterality Date  .  BREAST EXCISIONAL BIOPSY Bilateral 1976   neg  . COLONOSCOPY  10/08/2008   Dr. Allen Norris - normal; repeat 10 yr  . DILATION AND CURETTAGE OF UTERUS     x2  . LEEP N/A 06/14/2016   Procedure: LOOP ELECTROSURGICAL EXCISION PROCEDURE (LEEP);  Surgeon: Brayton Mars, MD;  Location: [facility] ORS;  Service: Gynecology;  Laterality: N/A;  . MOHS SURGERY     Left Leg- behind knee   . 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 SHOULDER ARTHROSCOPY W/ ROTATOR CUFF REPAIR Right     Social History   Tobacco Use  . Smoking status: Never Smoker  . Smokeless tobacco: Never Used  Substance Use Topics  . Alcohol use: Yes    Alcohol/week: 0.0 standard drinks    Comment: occasional  . Drug use: No     Medication list has been reviewed and updated.  Current Meds  Medication Sig  . ACCU-CHEK GUIDE test strip TEST TWICE DAILY  . blood glucose meter kit and supplies Dispense based on patient and insurance preference. Use up to two times daily as directed. (FOR ICD-10 E10.9, E11.9).  Marland Kitchen Coenzyme Q10 (CO Q 10 PO) Take by mouth.  . DULoxetine (CYMBALTA) 60 MG capsule Take 60 mg by mouth daily.  Marland Kitchen escitalopram (LEXAPRO) 10 MG tablet Take 10 mg by mouth daily.  . Multiple Vitamins-Minerals (MULTIVITAMIN WITH MINERALS) tablet Take 1 tablet by mouth daily.    PHQ 2/9 Scores 07/25/2019 07/25/2019 05-26-19 04/25/2019  PHQ - 2 Score 0 0 0 0  PHQ- 9 Score 0 - - -    BP Readings from Last 3 Encounters:  07/25/19 132/84  05/15/19 122/74  04/25/19 108/74    Physical Exam Vitals signs and nursing note reviewed.  Constitutional:      General: She is not in acute distress.    Appearance: Normal appearance. She is well-developed.  Cardiovascular:     Rate and Rhythm: Normal rate and regular rhythm.     Heart sounds: Normal heart sounds.  Pulmonary:     Effort: Pulmonary effort is normal. No respiratory distress.     Breath sounds: Normal breath sounds.  Abdominal:     General:  Bowel sounds are normal.     Palpations: Abdomen is soft.     Tenderness: There is abdominal tenderness in the suprapubic area. There is no guarding or rebound.  Neurological:     Mental Status: She is alert.     Wt Readings from Last 3 Encounters:  07/25/19 167 lb (75.8 kg)  05/15/19 169 lb (76.7 kg)  04/25/19 169 lb (76.7 kg)    BP 132/84   Pulse 81   Ht 5' 5"  (1.651 m)   Wt 167 lb (75.8 kg)   SpO2 96%   BMI 27.79 kg/m   Assessment and Plan: 1. Acute cystitis without hematuria Will treat with additional 7 days of Cipro in case the infection is recurring, even though UA is negative DDX - renal stone, IC, other bladder pathology - POCT urinalysis dipstick - ciprofloxacin (CIPRO) 250 MG tablet; Take 1 tablet (250 mg total) by mouth 2 (two) times daily for 7 days.  Dispense: 14 tablet; Refill: 0   Partially dictated using Editor, commissioning. Any errors are unintentional.  Halina Maidens, MD [facility]  07/25/2019

## 2019-07-30 ENCOUNTER — Other Ambulatory Visit: Payer: Self-pay

## 2019-07-30 ENCOUNTER — Emergency Department: Payer: BC Managed Care – PPO

## 2019-07-30 ENCOUNTER — Encounter: Payer: Self-pay | Admitting: Emergency Medicine

## 2019-07-30 DIAGNOSIS — K5900 Constipation, unspecified: Secondary | ICD-10-CM | POA: Insufficient documentation

## 2019-07-30 DIAGNOSIS — E119 Type 2 diabetes mellitus without complications: Secondary | ICD-10-CM | POA: Diagnosis not present

## 2019-07-30 DIAGNOSIS — R1030 Lower abdominal pain, unspecified: Secondary | ICD-10-CM | POA: Diagnosis present

## 2019-07-30 DIAGNOSIS — Z79899 Other long term (current) drug therapy: Secondary | ICD-10-CM | POA: Diagnosis not present

## 2019-07-30 LAB — URINALYSIS, COMPLETE (UACMP) WITH MICROSCOPIC
Bilirubin Urine: NEGATIVE
Glucose, UA: NEGATIVE mg/dL
Hgb urine dipstick: NEGATIVE
Ketones, ur: NEGATIVE mg/dL
Leukocytes,Ua: NEGATIVE
Nitrite: NEGATIVE
Protein, ur: NEGATIVE mg/dL
Specific Gravity, Urine: 1.011 (ref 1.005–1.030)
pH: 7 (ref 5.0–8.0)

## 2019-07-30 LAB — CBC
HCT: 43.2 % (ref 36.0–46.0)
Hemoglobin: 14.4 g/dL (ref 12.0–15.0)
MCH: 30.8 pg (ref 26.0–34.0)
MCHC: 33.3 g/dL (ref 30.0–36.0)
MCV: 92.3 fL (ref 80.0–100.0)
Platelets: 233 10*3/uL (ref 150–400)
RBC: 4.68 MIL/uL (ref 3.87–5.11)
RDW: 11.9 % (ref 11.5–15.5)
WBC: 10.4 10*3/uL (ref 4.0–10.5)
nRBC: 0 % (ref 0.0–0.2)

## 2019-07-30 LAB — COMPREHENSIVE METABOLIC PANEL
ALT: 32 U/L (ref 0–44)
AST: 28 U/L (ref 15–41)
Albumin: 4 g/dL (ref 3.5–5.0)
Alkaline Phosphatase: 89 U/L (ref 38–126)
Anion gap: 8 (ref 5–15)
BUN: 17 mg/dL (ref 6–20)
CO2: 28 mmol/L (ref 22–32)
Calcium: 9.4 mg/dL (ref 8.9–10.3)
Chloride: 101 mmol/L (ref 98–111)
Creatinine, Ser: 0.58 mg/dL (ref 0.44–1.00)
GFR calc Af Amer: >60 mL/min (ref >60)
GFR calc non Af Amer: >60 mL/min (ref >60)
Glucose, Bld: 189 mg/dL — ABNORMAL HIGH (ref 70–99)
Potassium: 3.8 mmol/L (ref 3.5–5.1)
Sodium: 137 mmol/L (ref 135–145)
Total Bilirubin: 0.4 mg/dL (ref 0.3–1.2)
Total Protein: 7 g/dL (ref 6.5–8.1)

## 2019-07-30 LAB — LIPASE, BLOOD: Lipase: 69 U/L — ABNORMAL HIGH (ref 11–51)

## 2019-07-30 NOTE — ED Triage Notes (Signed)
Patient to ER for c/o not being able to urinate or have a BM today. Patient does not have frequent constipation, but occasionally. Patient states she has clear gel coming from rectum when trying to have BM (no actual BM in 3-4 days). Patient has h/o IBS and interstitial cystitis. Patient reports having a trickle when she tries to urinate (last normal urination was upon waking this am). Denies any known fevers.

## 2019-07-31 ENCOUNTER — Emergency Department
Admission: EM | Admit: 2019-07-31 | Discharge: 2019-07-31 | Disposition: A | Discharge status: Home / Self Care | Payer: BC Managed Care – PPO | Attending: Emergency Medicine | Admitting: Emergency Medicine

## 2019-07-31 DIAGNOSIS — R1030 Lower abdominal pain, unspecified: Secondary | ICD-10-CM

## 2019-07-31 DIAGNOSIS — K5900 Constipation, unspecified: Secondary | ICD-10-CM

## 2019-07-31 HISTORY — DX: Depression, unspecified: F32.A

## 2019-07-31 HISTORY — DX: Irritable bowel syndrome, unspecified: K58.9

## 2019-07-31 HISTORY — DX: Interstitial cystitis (chronic) without hematuria: N30.10

## 2019-07-31 MED ORDER — POLYETHYLENE GLYCOL 3350 17 GM/SCOOP PO POWD: ORAL | 0 refills | Status: DC

## 2019-07-31 MED ORDER — METOCLOPRAMIDE HCL 10 MG PO TABS: 10.0000 mg | ORAL_TABLET | Freq: Four times a day (QID) | ORAL | 0 refills | Status: DC | PRN

## 2019-07-31 MED ORDER — SENNOSIDES-DOCUSATE SODIUM 8.6-50 MG PO TABS: 2.0000 | ORAL_TABLET | Freq: Two times a day (BID) | ORAL | 0 refills | Status: DC

## 2019-07-31 MED ORDER — MAGNESIUM CITRATE PO SOLN
1.0000 | Freq: Once | ORAL | Status: AC
  Administered 2019-07-31: 01:00:00 1 via ORAL

## 2019-07-31 NOTE — ED Provider Notes (Signed)
[facility] Emergency Department Provider Note  ____________________________________________  Time seen: Approximately 12:59 AM  I have reviewed the triage vital signs and the nursing notes.   HISTORY  Chief Complaint Lower abdominal pain   HPI Lauren Barrera is a 58 y.o. female with a history of IBS, interstitial cystitis, constipation who comes the ED complaining of difficulty urinating or moving her bowels.  Last bowel movement was 4 days ago.  Has nausea but no vomiting.  Associated with lower abdominal pain which is nonradiating, no aggravating or alleviating factors, moderate intensity, crampy.  No vomiting fevers or chills.  Status post appendectomy      Past Medical History:  Diagnosis Date  . Anxiety   . Constipation   . Depression   . IBS (irritable bowel syndrome)   . Interstitial cystitis   . Sinus congestion      Patient Active Problem List   Diagnosis Date Noted  . Diabetes mellitus without complication ([place]) 73/71/0626  . Status post LEEP (loop electrosurgical excision procedure) of cervix 06/23/2016  . Dysplasia of cervix, low grade (CIN 1) 04/29/2016  . 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 Constipation due to slow transit 02/23/2016  . Hyperlipidemia 06/23/2015  . 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 Anxiety and depression May 28, 2015  . ADD (attention deficit disorder) 2015-05-28  . Allergic state 28-May-2015  . Family history of sudden death May 28, 2015  . Bloodgood disease May 28, 2015  . Chronic interstitial cystitis 05/28/2015  . Hypochromic microcytic anemia 2015/05/28  . Obstructive sleep apnea of adult May 28, 2015     Past Surgical History:  Procedure Laterality Date  . BREAST EXCISIONAL BIOPSY Bilateral 1976   neg  . COLONOSCOPY  10/08/2008   Dr. Allen Norris - normal; repeat 10 yr  . DILATION AND CURETTAGE OF UTERUS     x2  . LEEP N/A 06/14/2016   Procedure: LOOP ELECTROSURGICAL EXCISION PROCEDURE (LEEP);  Surgeon:  Brayton Mars, MD;  Location: [facility] ORS;  Service: Gynecology;  Laterality: N/A;  . MOHS SURGERY     Left Leg- behind knee   . 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 SHOULDER ARTHROSCOPY W/ ROTATOR CUFF REPAIR Right      Prior to Admission medications   Medication Sig Start Date End Date Taking? Authorizing Provider  ACCU-CHEK GUIDE test strip TEST TWICE DAILY 06/26/19   Glean Hess, MD  blood glucose meter kit and supplies Dispense based on patient and insurance preference. Use up to two times daily as directed. (FOR ICD-10 E10.9, E11.9). May 28, 2019   Glean Hess, MD  ciprofloxacin (CIPRO) 250 MG tablet Take 1 tablet (250 mg total) by mouth 2 (two) times daily for 7 days. 07/25/19 08/01/19  Glean Hess, MD  Coenzyme Q10 (CO Q 10 PO) Take by mouth.    [provider]  DULoxetine (CYMBALTA) 60 MG capsule Take 60 mg by mouth daily.    [provider]  escitalopram (LEXAPRO) 10 MG tablet Take 10 mg by mouth daily. 04/01/19   [provider]  metoCLOPramide (REGLAN) 10 MG tablet Take 1 tablet (10 mg total) by mouth every 6 (six) hours as needed. 07/31/19   Carrie Mew, MD  Multiple Vitamins-Minerals (MULTIVITAMIN WITH MINERALS) tablet Take 1 tablet by mouth daily.    [provider]  polyethylene glycol powder (GLYCOLAX/MIRALAX) 17 GM/SCOOP powder 1 cap full in a full glass of water, two times a day for 3 days. 07/31/19  Carrie Mew, MD  senna-docusate (SENOKOT-S) 8.6-50 MG tablet Take 2 tablets by mouth 2 (two) times daily. 07/31/19   Carrie Mew, MD     Allergies Codeine   Family History  Problem Relation Age of Onset  . Diabetes Mother   . Heart disease Father   . Diabetes Father   . Sudden death Brother        age 1  . Breast cancer Maternal Aunt 28  . Breast cancer Paternal Aunt 68  . Ovarian cancer Neg Hx   . Colon cancer Neg Hx     Social History Social History   Tobacco Use   . Smoking status: Never Smoker  . Smokeless tobacco: Never Used  Substance Use Topics  . Alcohol use: Yes    Alcohol/week: 0.0 standard drinks    Comment: occasional  . Drug use: No    Review of Systems  Constitutional:   No fever or chills.  ENT:   No sore throat. No rhinorrhea. Cardiovascular:   No chest pain or syncope. Respiratory:   No dyspnea or cough. Gastrointestinal:   Positive as above for constipation and lower abdominal pain Musculoskeletal:   Negative for focal pain or swelling All other systems reviewed and are negative except as documented above in ROS and HPI.  ____________________________________________   PHYSICAL EXAM:  VITAL SIGNS: ED Triage Vitals  Enc Vitals Group     BP 07/30/19 2043 (!) 156/89     Pulse Rate 07/30/19 2043 74     Resp 07/30/19 2043 20     Temp 07/30/19 2043 98.3 F (36.8 C)     Temp Source 07/30/19 2043 Oral     SpO2 07/30/19 2043 99 %     Weight 07/30/19 2046 167 lb 1.7 oz (75.8 kg)     Height 07/30/19 2046 5' 5" (1.651 m)     Head Circumference --      Peak Flow --      Pain Score 07/30/19 2045 4     Pain Loc --      Pain Edu? --      Excl. in [place]? --     Vital signs reviewed, nursing assessments reviewed.   Constitutional:   Alert and oriented. Non-toxic appearance. Eyes:   Conjunctivae are normal. EOMI. PERRL. ENT         Mouth/Throat:   MMM, no pharyngeal erythema. No peritonsillar mass.       Neck:   No meningismus. Full ROM. Hematological/Lymphatic/Immunilogical:   No cervical lymphadenopathy. Cardiovascular:   RRR. Symmetric bilateral radial and DP pulses.  No murmurs. Cap refill less than 2 seconds. Respiratory:   Normal respiratory effort without tachypnea/retractions. Breath sounds are clear and equal bilaterally. No wheezes/rales/rhonchi. Gastrointestinal:   Soft and nontender.  Non distended. There is no CVA tenderness.  No rebound, rigidity, or guarding.  Musculoskeletal:   Normal range of motion in all  extremities. No joint effusions.  No lower extremity tenderness.  No edema. Neurologic:   Normal speech and language.  Motor grossly intact. No acute focal neurologic deficits are appreciated.  Skin:    Skin is warm, dry and intact. No rash noted.  No petechiae, purpura, or bullae.  ____________________________________________    LABS (pertinent positives/negatives) (all labs ordered are listed, but only abnormal results are displayed) Labs Reviewed  LIPASE, BLOOD - Abnormal; Notable for the following components:      Result Value   Lipase 69 (*)    All other components within normal  limits  COMPREHENSIVE METABOLIC PANEL - Abnormal; Notable for the following components:   Glucose, Bld 189 (*)    All other components within normal limits  URINALYSIS, COMPLETE (UACMP) WITH MICROSCOPIC - Abnormal; Notable for the following components:   Color, Urine YELLOW (*)    APPearance HAZY (*)    Bacteria, UA FEW (*)    All other components within normal limits  CBC   ____________________________________________   EKG    ____________________________________________    RADIOLOGY  Dg Abdomen 1 View  Result Date: 07/30/2019 CLINICAL DATA:  Abdominal pain.  No bowel movement 3-4 days EXAM: ABDOMEN - 1 VIEW COMPARISON:  None. FINDINGS: Moderate amount of stool throughout the colon including the right and left colon. Negative for bowel obstruction. No bowel wall edema. Surgical clips right lower quadrant in the region of the cecum/appendix. No abnormal calcifications.  No acute skeletal abnormality. IMPRESSION: Moderate amount of stool in the colon without evidence of bowel obstruction Electronically Signed   By: Franchot Gallo M.D.   On: 07/30/2019 21:13    ____________________________________________   PROCEDURES Procedures  ____________________________________________    CLINICAL IMPRESSION / ASSESSMENT AND PLAN / ED COURSE  Medications ordered in the ED: Medications   magnesium citrate solution 1 Bottle (has no administration in time range)    Pertinent labs & imaging results that were available during my care of the patient were reviewed by me and considered in my medical decision making (see chart for details).  Lauren Barrera was evaluated in Emergency Department on 07/31/2019 for the symptoms described in the history of present illness. She was evaluated in the context of the global COVID-19 pandemic, which necessitated consideration that the patient might be at risk for infection with the SARS-CoV-2 virus that causes COVID-19. Institutional protocols and algorithms that pertain to the evaluation of patients at risk for COVID-19 are in a state of rapid change based on information released by regulatory bodies including the CDC and federal and state organizations. These policies and algorithms were followed during the patient's care in the ED.   Patient presents with lower abdominal cramping pain, constipation.  Exam is reassuring, vital signs unremarkable, initial labs urinalysis and KUB all unremarkable except for x-ray showing moderate amount of stool.Considering the patient's symptoms, medical history, and physical examination today, I have low suspicion for cholecystitis or biliary pathology, pancreatitis, perforation or bowel obstruction, hernia, intra-abdominal abscess, AAA or dissection, volvulus or intussusception, mesenteric ischemia, or appendicitis.  We will treat with magnesium citrate, laxative regimen and follow-up with primary care.      ____________________________________________   FINAL CLINICAL IMPRESSION(S) / ED DIAGNOSES    Final diagnoses:  Constipation, unspecified constipation type  Lower abdominal pain     ED Discharge Orders         Ordered    polyethylene glycol powder (GLYCOLAX/MIRALAX) 17 GM/SCOOP powder     07/31/19 0058    senna-docusate (SENOKOT-S) 8.6-50 MG tablet  2 times daily     07/31/19 0058     metoCLOPramide (REGLAN) 10 MG tablet  Every 6 hours PRN     07/31/19 0058          Portions of this note were generated with dragon dictation software. Dictation errors may occur despite best attempts at proofreading.   Carrie Mew, MD 07/31/19 [phone]

## 2019-07-31 NOTE — Discharge Instructions (Signed)
Take medications as prescribed for the next 5 days, or until you have had several large bowel movements and are feeling better.

## 2019-08-15 ENCOUNTER — Ambulatory Visit: Payer: BC Managed Care – PPO | Admitting: Internal Medicine

## 2019-08-27 ENCOUNTER — Other Ambulatory Visit: Payer: Self-pay | Admitting: Internal Medicine

## 2019-08-27 DIAGNOSIS — Z1231 Encounter for screening mammogram for malignant neoplasm of breast: Secondary | ICD-10-CM

## 2019-10-12 ENCOUNTER — Ambulatory Visit
Admission: RE | Admit: 2019-10-12 | Discharge: 2019-10-12 | Disposition: A | Discharge status: Home / Self Care | Payer: BC Managed Care – PPO | Source: Ambulatory Visit | Attending: Internal Medicine | Admitting: Internal Medicine

## 2019-10-12 DIAGNOSIS — Z1231 Encounter for screening mammogram for malignant neoplasm of breast: Secondary | ICD-10-CM | POA: Diagnosis present

## 2020-04-25 ENCOUNTER — Encounter: Payer: BC Managed Care – PPO | Admitting: Internal Medicine

## 2020-09-18 ENCOUNTER — Other Ambulatory Visit: Payer: Self-pay | Admitting: Internal Medicine

## 2020-09-18 DIAGNOSIS — Z1231 Encounter for screening mammogram for malignant neoplasm of breast: Secondary | ICD-10-CM

## 2021-01-21 ENCOUNTER — Ambulatory Visit
Admission: RE | Admit: 2021-01-21 | Discharge: 2021-01-21 | Disposition: A | Discharge status: Home / Self Care | Payer: BC Managed Care – PPO | Source: Ambulatory Visit | Attending: Internal Medicine | Admitting: Internal Medicine

## 2021-01-21 ENCOUNTER — Other Ambulatory Visit: Payer: Self-pay

## 2021-01-21 DIAGNOSIS — Z1231 Encounter for screening mammogram for malignant neoplasm of breast: Secondary | ICD-10-CM | POA: Insufficient documentation

## 2021-05-26 ENCOUNTER — Encounter: Payer: BC Managed Care – PPO | Attending: Internal Medicine | Admitting: *Deleted

## 2021-05-26 ENCOUNTER — Encounter: Payer: Self-pay | Admitting: *Deleted

## 2021-05-26 ENCOUNTER — Other Ambulatory Visit: Payer: Self-pay

## 2021-05-26 VITALS — BP 108/70 | Ht 65.0 in | Wt 163.8 lb

## 2021-05-26 DIAGNOSIS — E119 Type 2 diabetes mellitus without complications: Secondary | ICD-10-CM | POA: Insufficient documentation

## 2021-05-26 DIAGNOSIS — E1165 Type 2 diabetes mellitus with hyperglycemia: Secondary | ICD-10-CM

## 2021-05-26 NOTE — Patient Instructions (Addendum)
Check blood sugars before breakfast and 2 hours after supper 3  x week Bring blood sugar records to the next appointment  Exercise:  Continue program for    60  minutes   3-4  days a week  Eat 3 meals day,   1-2  snacks a day Space meals 4-6 hours apart Include 1 serving of protein when having fruit for a snack Complete 3 Day Food Record and bring to next appt  Carry fast acting glucose and a snack at all times  Return for appointment on:  Thursday June 18, 2021 at 3:30 pm with Freda Munro (nurse)

## 2021-05-26 NOTE — Progress Notes (Signed)
Diabetes Self-Management Education  Visit Type: First/Initial  Appt. Start Time: 1335 Appt. End Time: 1500  05/26/2021  [name], identified by name and date of birth, is a 60 y.o. female with a diagnosis of Diabetes: Type 2.   ASSESSMENT  Blood pressure 108/70, height 5\' 5"  (1.651 m), weight 163 lb 12.8 oz (74.3 kg). Body mass index is 27.26 kg/m.   Diabetes Self-Management Education - 05/26/21 1516       Visit Information   Visit Type First/Initial      Initial Visit   Diabetes Type Type 2    Are you currently following a meal plan? Yes    What type of meal plan do you follow? "low carb, less sugar, less alcohol"    Are you taking your medications as prescribed? Yes    Date Diagnosed ? 2 years      Health Coping   How would you rate your overall health? Good;Fair      Psychosocial Assessment   Patient Belief/Attitude about Diabetes Motivated to manage diabetes   "concerned about the amount of time that it is taking to bring my A1C down"   Self-care barriers None    Self-management support Doctor's office;Family    Patient Concerns Nutrition/Meal planning;Glycemic Control;Monitoring    Special Needs None    Preferred Learning Style Visual    Learning Readiness Change in progress    How often do you need to have someone help you when you read instructions, pamphlets, or other written materials from your doctor or pharmacy? 1 - Never    What is the last grade level you completed in school? masters      Pre-Education Assessment   Patient understands the diabetes disease and treatment process. Needs Review    Patient understands incorporating nutritional management into lifestyle. Needs Review    Patient undertands incorporating physical activity into lifestyle. Demonstrates understanding / competency    Patient understands using medications safely. Needs Instruction    Patient understands monitoring blood glucose, interpreting and using results Needs Review     Patient understands prevention, detection, and treatment of acute complications. Needs Instruction    Patient understands prevention, detection, and treatment of chronic complications. Needs Review    Patient understands how to develop strategies to address psychosocial issues. Needs Review    Patient understands how to develop strategies to promote health/change behavior. Needs Review      Complications   Last HgB A1C per patient/outside source 8.8 %   05/12/2021   How often do you check your blood sugar? 0 times/day (not testing)   Testing 1 x week   Fasting Blood glucose range (mg/dL) 130-179   She reports FBG today of 135 mg/dL.   Number of hypoglycemic episodes per month --   Pt reports some symptoms of hypoglycemia but doesn't check blood sugar.   Have you had a dilated eye exam in the past 12 months? Yes    Have you had a dental exam in the past 12 months? Yes    Are you checking your feet? No      Dietary Intake   Breakfast Almond Kind bar    Lunch tortilla chips with cheese, bacon and peach; tortilla wrap with [place] and cheese; nuts or cookies    Snack (afternoon) nuts, sesame sticks, no sugar added ice cream, fruit (peach, watermelon, apples, berries, grapes)    Dinner chicken, beef, pork, fish; sweet potatoes, occasional corn on cob, some beans, rice, some pasta, brocolli,  cauliflower, squahs, zucchinie, brussels sprouts, salads with lettuce, celery, carrots, nuts, fruit and cheese    Beverage(s) water, unsweetened tea, diet soda      Exercise   Exercise Type Moderate (swimming / aerobic walking)   step class, toning   How many days per week to you exercise? 3.5    How many minutes per day do you exercise? 60    Total minutes per week of exercise 210      Patient Education   Previous Diabetes Education No    Disease state  Factors that contribute to the development of diabetes;Explored patient's options for treatment of their diabetes    Nutrition management  Role of diet in  the treatment of diabetes and the relationship between the three main macronutrients and blood glucose level;Food label reading, portion sizes and measuring food.;Carbohydrate counting;Reviewed blood glucose goals for pre and post meals and how to evaluate the patients' food intake on their blood glucose level.;Effects of alcohol on blood glucose and safety factors with consumption of alcohol.    Physical activity and exercise  Role of exercise on diabetes management, blood pressure control and cardiac health.    Medications Reviewed patients medication for diabetes, action, purpose, timing of dose and side effects.    Monitoring Purpose and frequency of SMBG.;Taught/discussed recording of test results and interpretation of SMBG.;Identified appropriate SMBG and/or A1C goals.    Acute complications Taught treatment of hypoglycemia - the 15 rule.    Chronic complications Relationship between chronic complications and blood glucose control    Psychosocial adjustment Role of stress on diabetes;Identified and addressed patients feelings and concerns about diabetes      Individualized Goals (developed by patient)   Reducing Risk Other (comment)   improve blood sugars, prevent diabetes complications, become more fit     Outcomes   Expected Outcomes Demonstrated interest in learning. Expect positive outcomes    Future DMSE 4-6 wks             Individualized Plan for Diabetes Self-Management Training:   Learning Objective:  Patient will have a greater understanding of diabetes self-management. Patient education plan is to attend individual and/or group sessions per assessed needs and concerns.   Plan:   Patient Instructions  Check blood sugars before breakfast and 2 hours after supper 3  x week Bring blood sugar records to the next appointment  Exercise:  Continue program for    60  minutes   3-4  days a week  Eat 3 meals day,   1-2  snacks a day Space meals 4-6 hours apart Include 1  serving of protein when having fruit for a snack Complete 3 Day Food Record and bring to next appt  Carry fast acting glucose and a snack at all times  Return for appointment on:  Thursday June 18, 2021 at 3:30 pm with Freda Munro (nurse)  Expected Outcomes:  Demonstrated interest in learning. Expect positive outcomes  Education material provided:  General Meal Planning Guidelines Simple Meal Plan 3 Day Food Record Symptoms, causes and treatments of Hypoglycemia   If problems or questions, patient to contact team via:  Johny Drilling, RN, [place] [phone]  Future DSME appointment: 4-6 wks June 18, 2021 with this nurse.

## 2021-06-16 ENCOUNTER — Telehealth: Payer: Self-pay | Admitting: *Deleted

## 2021-06-16 NOTE — Telephone Encounter (Signed)
Patient left voice mail that she has some questions. Called patient and she is having nausea possibly due to starting Ozempic. She has taken 2 injections and continues to experience nausea especially in the evening. Encouraged her to contact MD office and notify them for nausea medication. Discussed it may take several weeks for her body to become adjusted to this medication or she may not be able to tolerate it. She is trying to follow meal plan but relates difficulty due to nausea and craving carbohydrates. She reports BG better with reading of 128 mg/dL this morning. She has an appointment with me on Thursday.

## 2021-06-18 ENCOUNTER — Encounter: Payer: Self-pay | Admitting: *Deleted

## 2021-06-18 ENCOUNTER — Other Ambulatory Visit: Payer: Self-pay

## 2021-06-18 ENCOUNTER — Encounter: Payer: BC Managed Care – PPO | Attending: Internal Medicine | Admitting: *Deleted

## 2021-06-18 VITALS — BP 110/70 | Wt 158.9 lb

## 2021-06-18 DIAGNOSIS — E1165 Type 2 diabetes mellitus with hyperglycemia: Secondary | ICD-10-CM | POA: Insufficient documentation

## 2021-06-18 NOTE — Patient Instructions (Signed)
Check blood sugars 1 x day before breakfast or 2 hrs after one meal every day Check blood sugar for symptoms of lhypoglycemia  Continue exercise program for    60  minutes   3-4  days a week  Eat 3 meals day,   1-2  snacks a day Space meals 4-6 hours apart  Carry fast acting glucose and a snack at all times  Call for any questions

## 2021-06-18 NOTE — Progress Notes (Signed)
Diabetes Self-Management Education  Visit Type: Follow-up  Appt. Start Time: 1530 Appt. End Time: 3335  06/18/2021  [name], identified by name and date of birth, is a 60 y.o. female with a diagnosis of Diabetes: Type 2.   ASSESSMENT  Blood pressure 110/70, weight 158 lb 14.4 oz (72.1 kg). Body mass index is 26.44 kg/m.   Diabetes Self-Management Education - 06/18/21 1639       Visit Information   Visit Type Follow-up      Complications   How often do you check your blood sugar? 3-4 times / week    Fasting Blood glucose range (mg/dL) 70-129;130-179   FBG's 128-141 mg/dL   Postprandial Blood glucose range (mg/dL) 70-129;130-179;180-200   pp's 141, 99, 180 mg/dL   Number of hypoglycemic episodes per month 1   she reports shakiness after eating today but didn't check her blood sugar   Can you tell when your blood sugar is low? No    Have you had a dilated eye exam in the past 12 months? Yes    Have you had a dental exam in the past 12 months? Yes    Are you checking your feet? Yes    How many days per week are you checking your feet? 3      Dietary Intake   Breakfast eats 3 meals and 1 snacks/day - see 3 Day Food Record      Exercise   Exercise Type Light (walking / raking leaves)   has been on vacation and also reported that she hurt her back and had to decrease exercise   How many days per week to you exercise? 3    How many minutes per day do you exercise? 30    Total minutes per week of exercise 90      Patient Education   Disease state  Factors that contribute to the development of diabetes    Nutrition management  Role of diet in the treatment of diabetes and the relationship between the three main macronutrients and blood glucose level;Food label reading, portion sizes and measuring food.;Carbohydrate counting;Reviewed blood glucose goals for pre and post meals and how to evaluate the patients' food intake on their blood glucose level.    Physical activity and  exercise  Role of exercise on diabetes management, blood pressure control and cardiac health.    Medications Reviewed patients medication for diabetes, action, purpose, timing of dose and side effects.   Pt continues to have nausea related to Ozempic. She is going to take for 1 month and notify MD if this doesn't resolve.   Monitoring Purpose and frequency of SMBG.;Taught/discussed recording of test results and interpretation of SMBG.;Identified appropriate SMBG and/or A1C goals.    Acute complications Taught treatment of hypoglycemia - the 15 rule.    Chronic complications Relationship between chronic complications and blood glucose control    Psychosocial adjustment Identified and addressed patients feelings and concerns about diabetes      Individualized Goals (developed by patient)   Nutrition Follow meal plan discussed    Physical Activity Exercise 3-5 times per week;60 minutes per day    Monitoring  test my blood glucose as discussed      Post-Education Assessment   Patient understands the diabetes disease and treatment process. Demonstrates understanding / competency    Patient understands incorporating nutritional management into lifestyle. Needs Review    Patient undertands incorporating physical activity into lifestyle. Demonstrates understanding / competency    Patient  understands using medications safely. Demonstrates understanding / competency    Patient understands monitoring blood glucose, interpreting and using results Needs Review    Patient understands prevention, detection, and treatment of acute complications. Needs Review    Patient understands prevention, detection, and treatment of chronic complications. Demonstrates understanding / competency    Patient understands how to develop strategies to address psychosocial issues. Demonstrates understanding / competency    Patient understands how to develop strategies to promote health/change behavior. Demonstrates understanding /  competency      Outcomes   Expected Outcomes Demonstrated interest in learning. Expect positive outcomes    Program Status Completed      Subsequent Visit   Since your last visit have you continued or begun to take your medications as prescribed? Yes    Since your last visit have you had your blood pressure checked? Yes    Is your most recent blood pressure lower, unchanged, or higher since your last visit? Lower    Since your last visit have you experienced any weight changes? Loss    Weight Loss (lbs) 4.9    Since your last visit, are you checking your blood glucose at least once a day? No             Individualized Plan for Diabetes Self-Management Training:   Learning Objective:  Patient will have a greater understanding of diabetes self-management. Patient education plan is to attend individual and/or group sessions per assessed needs and concerns.   Plan:   Patient Instructions  Check blood sugars 1 x day before breakfast or 2 hrs after one meal every day Check blood sugar for symptoms of lhypoglycemia  Continue exercise program for    60  minutes   3-4  days a week  Eat 3 meals day,   1-2  snacks a day Space meals 4-6 hours apart  Carry fast acting glucose and a snack at all times  Call for any questions  Expected Outcomes:  Demonstrated interest in learning. Expect positive outcomes  Education material provided:  Planning a Balanced Meal Quick and Balanced Meals  If problems or questions, patient to contact team via:   Johny Drilling, RN, [place], [place] [phone]  Future DSME appointment: PRN

## 2022-02-11 ENCOUNTER — Other Ambulatory Visit: Payer: Self-pay

## 2022-03-08 ENCOUNTER — Other Ambulatory Visit: Payer: Self-pay | Admitting: Internal Medicine

## 2022-03-08 DIAGNOSIS — Z1231 Encounter for screening mammogram for malignant neoplasm of breast: Secondary | ICD-10-CM

## 2022-04-13 ENCOUNTER — Ambulatory Visit
Admission: RE | Admit: 2022-04-13 | Discharge: 2022-04-13 | Disposition: A | Discharge status: Home / Self Care | Payer: BC Managed Care – PPO | Source: Ambulatory Visit | Attending: Internal Medicine | Admitting: Internal Medicine

## 2022-04-13 DIAGNOSIS — Z1231 Encounter for screening mammogram for malignant neoplasm of breast: Secondary | ICD-10-CM | POA: Diagnosis present

## 2022-05-21 IMAGING — MG MM DIGITAL SCREENING BILAT W/ TOMO AND CAD
8 series · 9 of 24 positions shown · non-contrast
Comparison: Previous exam(s).

CLINICAL DATA: Screening.

EXAM:
DIGITAL SCREENING BILATERAL MAMMOGRAM WITH TOMOSYNTHESIS AND CAD
TECHNIQUE: Bilateral screening digital craniocaudal and mediolateral oblique
mammograms were obtained. Bilateral screening digital breast
tomosynthesis was performed. The images were evaluated with
computer-aided detection.

[R CC synth-2D]
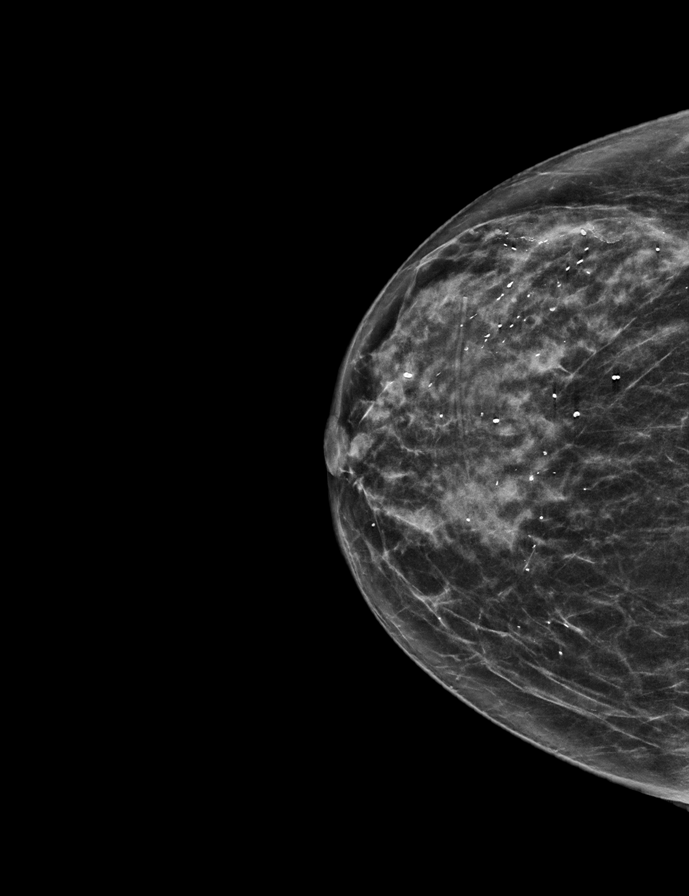

[L CC synth-2D]
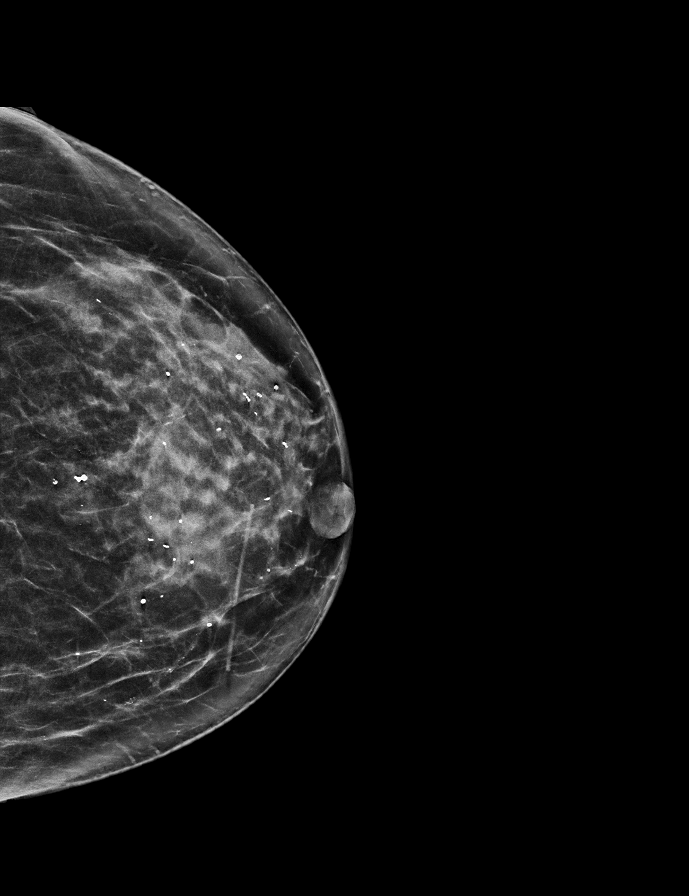

[L MLO synth-2D]
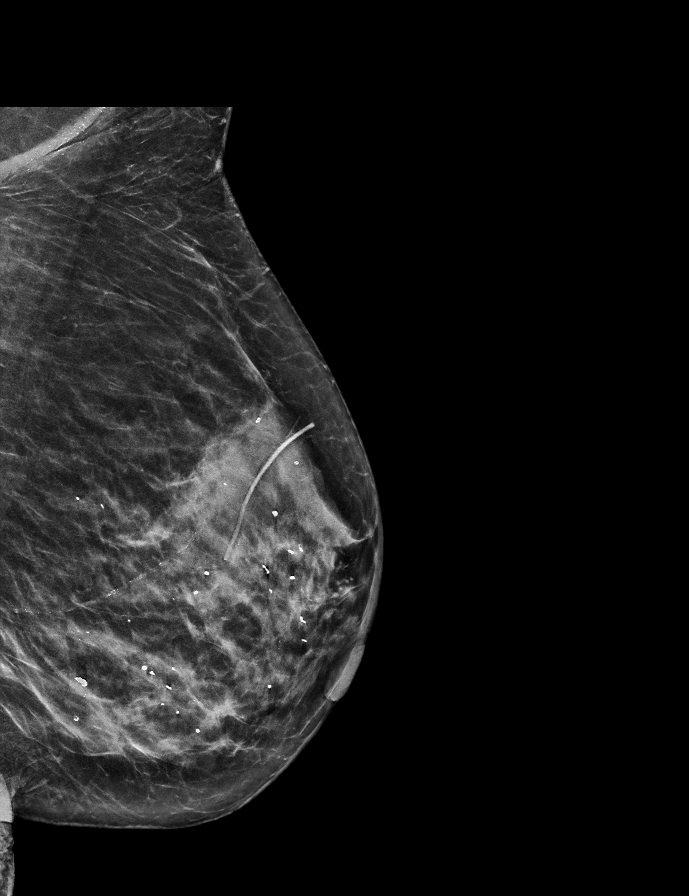

[R MLO synth-2D]
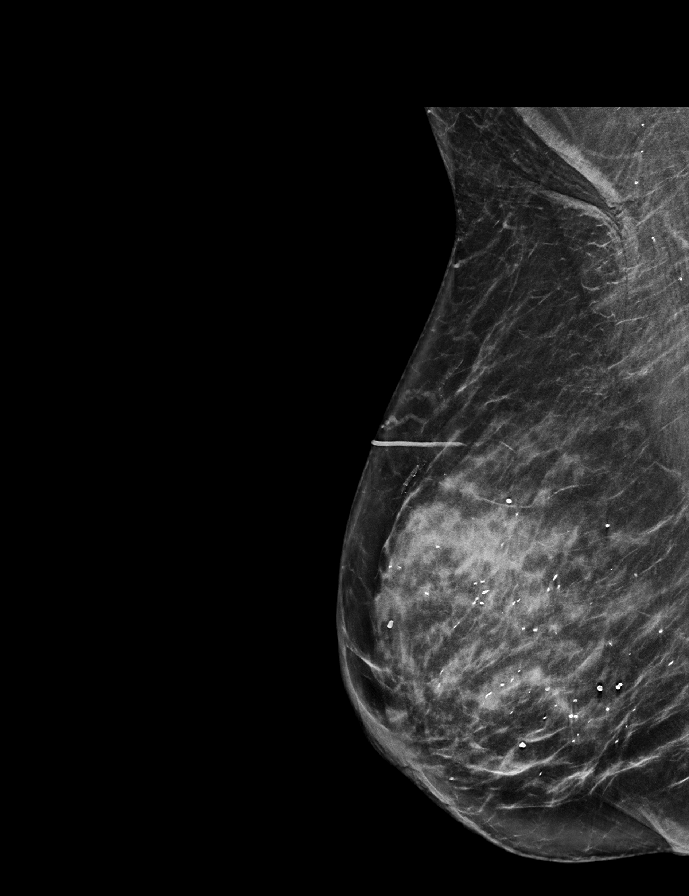

[L CC tomo · 2 of 59 frames shown]
[frame 20/59]
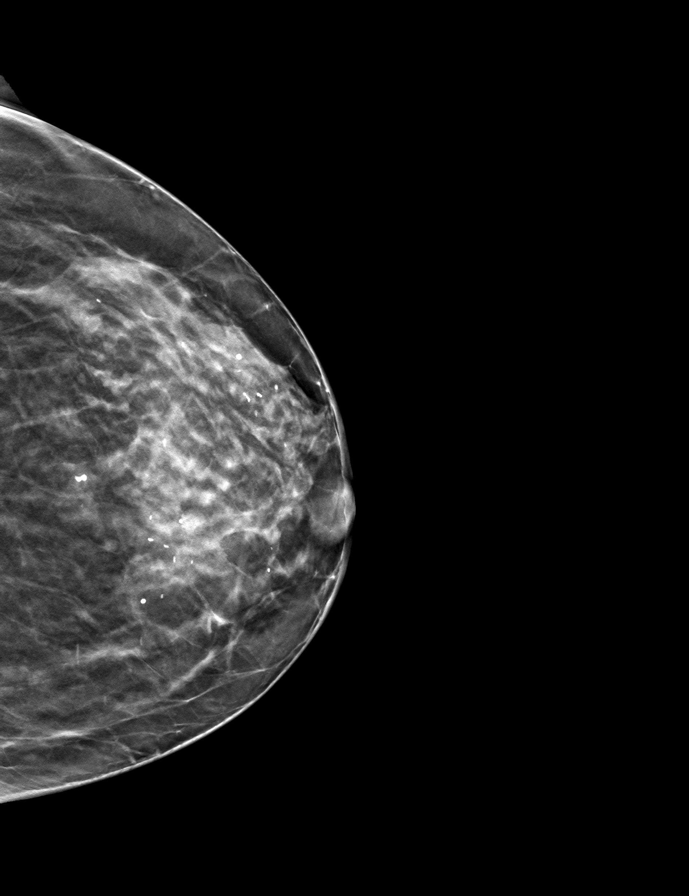
[frame 30/59]
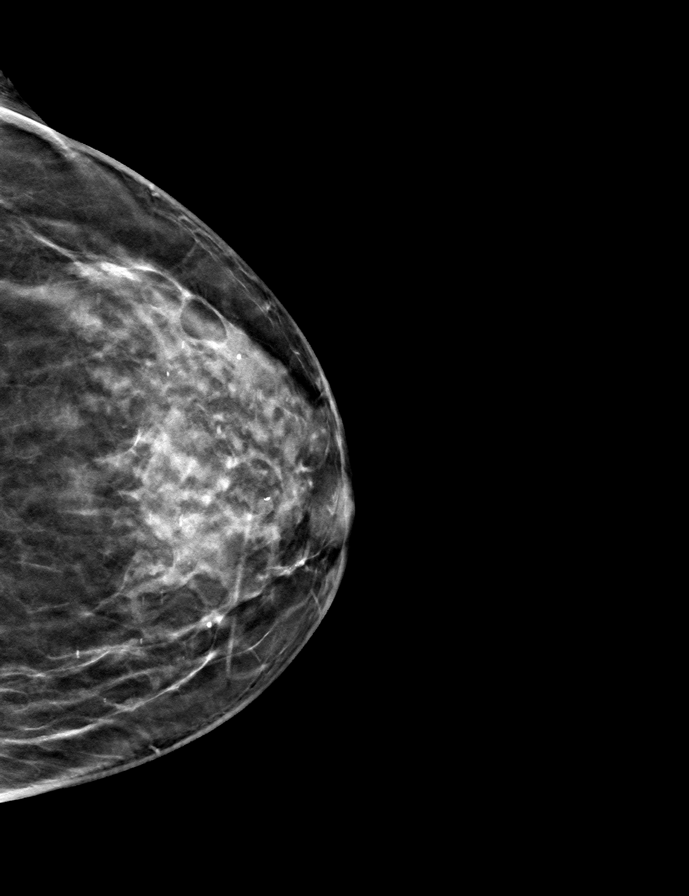

[R MLO tomo · tomo slice 34/67.0]
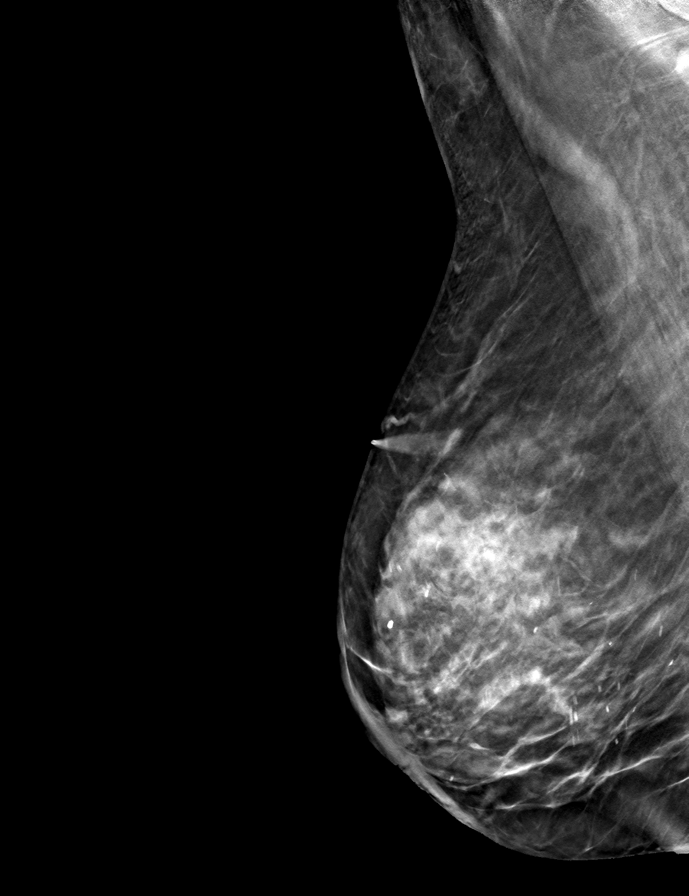

[R CC tomo · tomo slice 29/56.0]
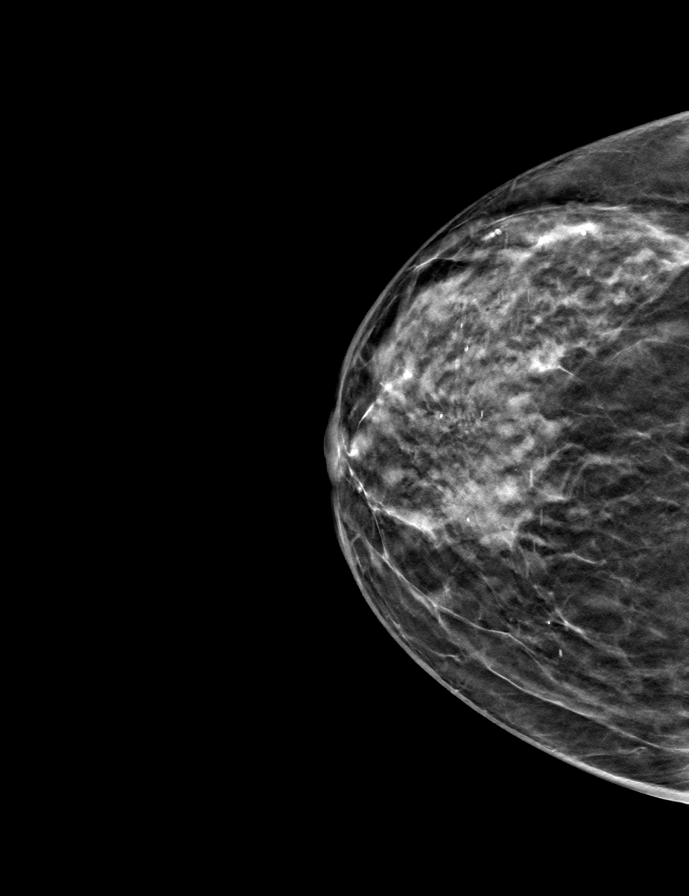

[L MLO tomo · tomo slice 31/60.0]
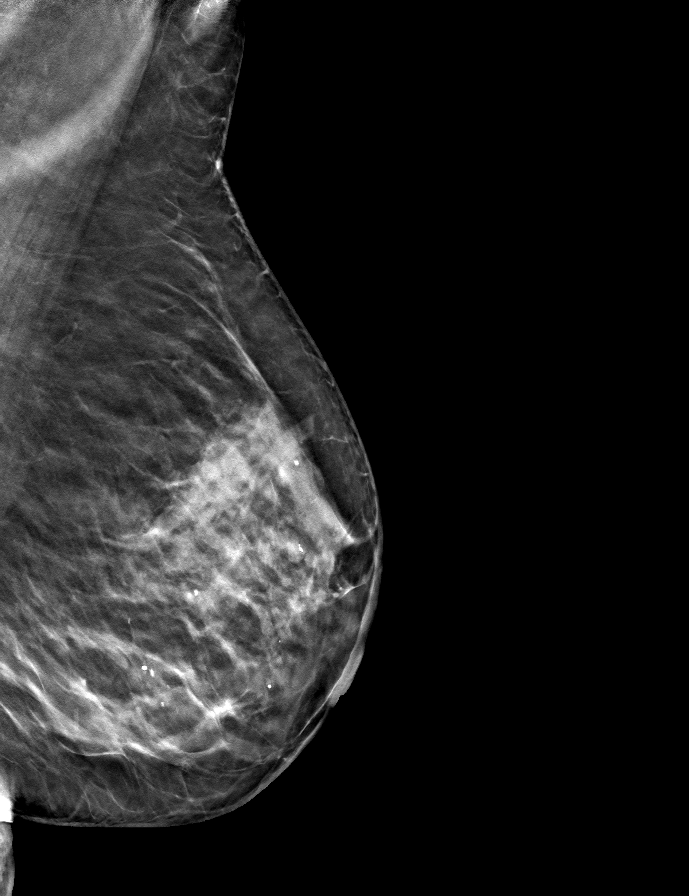

[9 of 24 positions shown; findings below may reference images not displayed]

ACR Breast Density Category c: The breast tissue is heterogeneously
dense, which may obscure small masses.
FINDINGS: There are no findings suspicious for malignancy.
IMPRESSION: No mammographic evidence of malignancy. A result letter of this
screening mammogram will be mailed directly to the patient.

RECOMMENDATION:
Screening mammogram in one year. (Code:[id number]-[id number])

BI-RADS CATEGORY  1: Negative.

## 2022-07-18 NOTE — Progress Notes (Signed)
Cardiology Office Note  Date:  07/19/2022   ID:  Lauren Barrera, DOB 1961-07-06, MRN [id number]  PCP:  Gladstone Lighter, MD   Chief Complaint  Patient presents with   New Patient (Initial Visit)    Ref by Dr. Tressia Miners for family Hx of CAD; 1st cousin had CABG x 3 with having an MI at age 63.  Patient c/o shortness of breath and a "pausing" sensation in chest. Medications reviewed by the patient verbally.     HPI:  [name] is a 61 year old woman with past medical history of Hypertension Diabetes type 2, past few years Per lipidemia Who presents by referral from Dr. Tressia Miners for consultation of her family history of coronary artery disease, cardiac risk factors  On her visit today reports feeling well with no significant chest pain or shortness of breath on exertion Concerned about strong family history of coronary disease, father, cousin passing post CABG  She hasbeen on Ozempic , lost to get weight, 40 pounds  also made diet and activity changes.  A1c has improved significantly around 6, previously 9-10  Lab work reviewed Total cholesterol 146 LDL 55 A1C 6.2  Family hx Cousin died 64 yo, died post CABG Father with CAD  EKG personally reviewed by myself on todays visit Normal sinus rhythm rate 79 bpm no significant ST-T wave changes   PMH:   has a past medical history of Anxiety, Constipation, Depression, Diabetes mellitus without complication ([place]), IBS (irritable bowel syndrome), Interstitial cystitis, Sinus congestion, and Sleep apnea.  PSH:    Past Surgical History:  Procedure Laterality Date   BREAST EXCISIONAL BIOPSY Bilateral 1976   neg   COLONOSCOPY  10/08/2008   Dr. 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 Use up to two times daily as directed. (FOR ICD-10 E10.9, E11.9). 1 each 0   DULoxetine (CYMBALTA) 60 MG capsule Take 60 mg by mouth daily.     glimepiride (AMARYL) 4 MG tablet Take 4 mg by mouth daily.     lisinopril (ZESTRIL) 10 MG tablet Take 1 tablet by mouth daily.     Multiple Vitamins-Minerals (MULTIVITAMIN WITH MINERALS) tablet Take 1 tablet by mouth daily.     OZEMPIC, 0.25 OR 0.5 MG/DOSE, 2 MG/1.5ML SOPN Inject 0.25 mg into the skin once a week.     rosuvastatin (CRESTOR) 10 MG tablet Take 10 mg by mouth daily.     XIGDUO XR 04-999 MG TB24 Take 2 tablets by mouth every morning.     No current facility-administered medications for this visit.     Allergies:   Codeine   Social History:  The patient  reports that she has never smoked. She has never used smokeless tobacco. She reports that she does not currently use alcohol after a past usage of about 2.0 standard drinks of alcohol per  week. She reports that she does not use drugs.   Family History:   family history includes Breast cancer (age of onset: 62) in her maternal aunt; Breast cancer (age of onset: 19) in her paternal aunt; Diabetes in her father and mother; Heart attack in her cousin; Heart disease (age of onset: 67) in her cousin; Heart disease (age of onset: 35) in her father; Hyperlipidemia in her father; Hypertension in her father; Sudden death in her brother.    Review of Systems: Review of Systems  Constitutional: Negative.   HENT: Negative.    Respiratory: Negative.    Cardiovascular: Negative.   Gastrointestinal: Negative.   Musculoskeletal: Negative.   Neurological: Negative.    Psychiatric/Behavioral: Negative.    All other systems reviewed and are negative.    PHYSICAL EXAM: VS:  BP 100/64 (BP Location: Right Arm, Patient Position: Sitting, Cuff Size: Normal)   Pulse 79   Ht 5' 5"  (1.651 m)   Wt 139 lb 2 oz (63.1 kg)   SpO2 98%   BMI 23.15 kg/m  , BMI Body mass index is 23.15 kg/m. GEN: Well nourished, well developed, in no acute distress HEENT: normal Neck: no JVD, carotid bruits, or masses Cardiac: RRR; no murmurs, rubs, or gallops,no edema  Respiratory:  clear to auscultation bilaterally, normal work of breathing GI: soft, nontender, nondistended, + BS MS: no deformity or atrophy Skin: warm and dry, no rash Neuro:  Strength and sensation are intact Psych: euthymic mood, full affect  Recent Labs: No results found for requested labs within last 365 days.    Lipid Panel Lab Results  Component Value Date   CHOL 245 (H) 04/25/2019   HDL 51 04/25/2019   LDLCALC 150 (H) 04/25/2019   TRIG 219 (H) 04/25/2019      Wt Readings from Last 3 Encounters:  07/19/22 139 lb 2 oz (63.1 kg)  06/18/21 158 lb 14.4 oz (72.1 kg)  05/26/21 163 lb 12.8 oz (74.3 kg)       ASSESSMENT AND PLAN:  Problem List Items Addressed This Visit   None Visit Diagnoses     Controlled type 2 diabetes mellitus without complication, without long-term current use of insulin ([place])    -  Primary   Relevant Orders   EKG 12-Lead   Essential hypertension       Relevant Orders   EKG 12-Lead      Diabetes type 2 without complications Numbers dramatically improved with weight loss, dietary changes, Ozempic Followed by endocrine, A1c around 6  Hyperlipidemia Numbers much improved on Crestor 10 daily  Essential hypertension With weight loss, blood pressure has been dropping, recommend she decrease lisinopril down to 5 mg daily  Preventive care/family history coronary disease No anginal symptoms, we have recommended screening study with CT coronary calcium  scoring Risk factor profile is excellent, non-smoker, diabetes well controlled, cholesterol well controlled    Total encounter time more than 60 minutes  Greater than 50% was spent in counseling and coordination of care with the patient  Patient seen in consultation for Dr. Tressia Miners and will be referred back to her office for ongoing care of the issues detailed above  Signed, Esmond Plants, M.D., Ph.D. [place], [place]

## 2022-07-19 ENCOUNTER — Encounter: Payer: Self-pay | Admitting: Cardiovascular Disease

## 2022-07-19 ENCOUNTER — Ambulatory Visit
Admission: RE | Admit: 2022-07-19 | Discharge: 2022-07-19 | Disposition: A | Discharge status: Home / Self Care | Payer: BC Managed Care – PPO | Source: Ambulatory Visit | Attending: Cardiovascular Disease | Admitting: Cardiovascular Disease

## 2022-07-19 ENCOUNTER — Ambulatory Visit: Payer: BC Managed Care – PPO | Admitting: Cardiovascular Disease

## 2022-07-19 VITALS — BP 100/64 | HR 79 | Ht 65.0 in | Wt 139.1 lb

## 2022-07-19 DIAGNOSIS — I10 Essential (primary) hypertension: Secondary | ICD-10-CM | POA: Insufficient documentation

## 2022-07-19 DIAGNOSIS — E119 Type 2 diabetes mellitus without complications: Secondary | ICD-10-CM | POA: Diagnosis not present

## 2022-07-19 DIAGNOSIS — Z8249 Family history of ischemic heart disease and other diseases of the circulatory system: Secondary | ICD-10-CM | POA: Diagnosis not present

## 2022-07-19 DIAGNOSIS — E782 Mixed hyperlipidemia: Secondary | ICD-10-CM | POA: Diagnosis not present

## 2022-07-19 NOTE — Patient Instructions (Addendum)
Medication Instructions:  Please cut the lisinopril in half daily  If you need a refill on your cardiac medications before your next appointment, please call your pharmacy.   Lab work: No new labs needed  Testing/Procedures:  We will order CT coronary calcium score  $99 at our [facility] in [place]  Please call [place] at [phone] to schedule   [place], [place] [address]   Follow-Up: At [facility], you and your health needs are our priority.  As part of our continuing mission to provide you with exceptional heart care, we have created designated Provider Care Teams.  These Care Teams include your primary Cardiologist (physician) and Advanced Practice Providers (APPs -  Physician Assistants and Nurse Practitioners) who all work together to provide you with the care you need, when you need it.  You will need a follow up appointment as needed  Providers on your designated Care Team:   Murray Hodgkins, NP Christell Faith, PA-C [place] Kathlen Mody, [place]  COVID-19 Vaccine Information can be found at: [email or web address] For questions related to vaccine distribution or appointments, please email [email or web address] or call [phone].

## 2022-07-20 ENCOUNTER — Encounter: Payer: Self-pay | Admitting: Cardiovascular Disease

## 2022-08-05 ENCOUNTER — Telehealth: Payer: Self-pay | Admitting: Cardiovascular Disease

## 2022-08-05 NOTE — Telephone Encounter (Signed)
Patient made aware of CT calcium score results. Patient would like to know if Dr. Rockey Situ thinks a low dose Asa 81 mg daily would be beneficial for her. Adv the patient that I will fwd the msg to Dr. Rockey Situ to advise.

## 2022-08-05 NOTE — Telephone Encounter (Signed)
Patient called wanting to discuss results of the CT Cardiac Scoring test.

## 2022-08-05 NOTE — Telephone Encounter (Signed)
CT calcium scoring Mildly elevated calcium score 287 Of note there is moderately dilated ascending aorta 4.6 cm Cholesterol is at goal for elevated calcium We will need to monitor ascending aorta with CT scan or echocardiogram on annual basis

## 2022-08-06 MED ORDER — ASPIRIN 81 MG PO TBEC: 81.0000 mg | DELAYED_RELEASE_TABLET | Freq: Every day | ORAL | 3 refills | Status: AC

## 2022-08-06 NOTE — Telephone Encounter (Signed)
Yes could consider aspirin 81 mg daily  Thx  TG   Spoke w/ pt.  Made her aware of Dr. Donivan Scull recommendation.  She is agreeable and would like asa 81 mg added to her med list.  She is appreciative of the call.

## 2022-10-14 ENCOUNTER — Other Ambulatory Visit: Payer: Self-pay

## 2022-11-30 ENCOUNTER — Other Ambulatory Visit: Payer: Self-pay

## 2023-03-14 ENCOUNTER — Other Ambulatory Visit: Payer: Self-pay | Admitting: Internal Medicine

## 2023-03-14 DIAGNOSIS — Z1231 Encounter for screening mammogram for malignant neoplasm of breast: Secondary | ICD-10-CM

## 2023-04-18 ENCOUNTER — Ambulatory Visit
Admission: RE | Admit: 2023-04-18 | Discharge: 2023-04-18 | Disposition: A | Discharge status: Home / Self Care | Payer: BC Managed Care – PPO | Source: Ambulatory Visit | Attending: Internal Medicine | Admitting: Internal Medicine

## 2023-04-18 DIAGNOSIS — Z1231 Encounter for screening mammogram for malignant neoplasm of breast: Secondary | ICD-10-CM

## 2023-07-28 ENCOUNTER — Other Ambulatory Visit: Payer: Self-pay

## 2023-08-29 ENCOUNTER — Other Ambulatory Visit: Payer: Self-pay

## 2024-02-24 ENCOUNTER — Other Ambulatory Visit
Admission: RE | Admit: 2024-02-24 | Discharge: 2024-02-24 | Disposition: A | Discharge status: Home / Self Care | Payer: Self-pay | Source: Ambulatory Visit | Attending: Student | Admitting: Student

## 2024-02-24 DIAGNOSIS — R000 Tachycardia, unspecified: Secondary | ICD-10-CM | POA: Diagnosis present

## 2024-02-24 DIAGNOSIS — U071 COVID-19: Secondary | ICD-10-CM | POA: Diagnosis present

## 2024-02-24 LAB — D-DIMER, QUANTITATIVE: D-Dimer, Quant: <0.27 ug{FEU}/mL (ref 0.00–0.50)

## 2024-06-19 ENCOUNTER — Other Ambulatory Visit: Payer: Self-pay | Admitting: Internal Medicine

## 2024-06-19 DIAGNOSIS — Z1231 Encounter for screening mammogram for malignant neoplasm of breast: Secondary | ICD-10-CM

## 2024-07-03 ENCOUNTER — Ambulatory Visit
Admission: RE | Admit: 2024-07-03 | Discharge: 2024-07-03 | Disposition: A | Discharge status: Home / Self Care | Source: Ambulatory Visit | Attending: Internal Medicine | Admitting: Internal Medicine

## 2024-07-03 DIAGNOSIS — Z1231 Encounter for screening mammogram for malignant neoplasm of breast: Secondary | ICD-10-CM | POA: Insufficient documentation

## 2024-12-11 ENCOUNTER — Other Ambulatory Visit: Payer: Self-pay

## 2024-12-14 ENCOUNTER — Other Ambulatory Visit: Payer: Self-pay | Admitting: Gastroenterology

## 2024-12-18 ENCOUNTER — Ambulatory Visit: Admission: RE | Admit: 2024-12-18 | Payer: Self-pay | Source: Home / Self Care | Admitting: Gastroenterology

## 2024-12-18 ENCOUNTER — Encounter: Admission: RE | Payer: Self-pay | Source: Home / Self Care

## 2024-12-18 SURGERY — COLONOSCOPY
Anesthesia: General
